# Patient Record
Sex: Male | Born: 1960 | Race: White | Hispanic: No | Marital: Married | Smoking: Never smoker
Health system: Southern US, Community
[De-identification: ages and names within clinical notes are randomized; demographics above are authoritative.]

## PROBLEM LIST (undated history)

## (undated) DIAGNOSIS — K219 Gastro-esophageal reflux disease without esophagitis: Secondary | ICD-10-CM

## (undated) DIAGNOSIS — F419 Anxiety disorder, unspecified: Secondary | ICD-10-CM

## (undated) DIAGNOSIS — I10 Essential (primary) hypertension: Secondary | ICD-10-CM

## (undated) DIAGNOSIS — T7840XA Allergy, unspecified, initial encounter: Secondary | ICD-10-CM

## (undated) DIAGNOSIS — M1990 Unspecified osteoarthritis, unspecified site: Secondary | ICD-10-CM

## (undated) HISTORY — DX: Allergy, unspecified, initial encounter: T78.40XA

## (undated) HISTORY — DX: Essential (primary) hypertension: I10

## (undated) HISTORY — PX: TONSILLECTOMY: SUR1361

## (undated) HISTORY — DX: Unspecified osteoarthritis, unspecified site: M19.90

## (undated) HISTORY — DX: Anxiety disorder, unspecified: F41.9

## (undated) HISTORY — DX: Gastro-esophageal reflux disease without esophagitis: K21.9

## (undated) HISTORY — PX: EYE SURGERY: SHX253

---

## 2001-08-24 ENCOUNTER — Emergency Department (HOSPITAL_COMMUNITY): Admission: EM | Admit: 2001-08-24 | Discharge: 2001-08-24 | Payer: Self-pay | Admitting: *Deleted

## 2008-09-08 ENCOUNTER — Encounter: Admission: RE | Admit: 2008-09-08 | Discharge: 2008-09-08 | Payer: Self-pay | Admitting: Family Medicine

## 2010-05-22 ENCOUNTER — Emergency Department (HOSPITAL_COMMUNITY)
Admission: EM | Admit: 2010-05-22 | Discharge: 2010-05-22 | Payer: Self-pay | Source: Home / Self Care | Admitting: Emergency Medicine

## 2010-07-31 LAB — BASIC METABOLIC PANEL
Calcium: 9.6 mg/dL (ref 8.4–10.5)
Creatinine, Ser: 1.11 mg/dL (ref 0.4–1.5)
GFR calc Af Amer: >60 mL/min (ref >60)
GFR calc non Af Amer: >60 mL/min (ref >60)

## 2010-07-31 LAB — DIFFERENTIAL
Eosinophils Absolute: 0 10*3/uL (ref 0.0–0.7)
Lymphocytes Relative: 8 % — ABNORMAL LOW (ref 12–46)
Lymphs Abs: 0.8 10*3/uL (ref 0.7–4.0)
Neutro Abs: 9.2 10*3/uL — ABNORMAL HIGH (ref 1.7–7.7)
Neutrophils Relative %: 84 % — ABNORMAL HIGH (ref 43–77)

## 2010-07-31 LAB — CBC
Platelets: 270 10*3/uL (ref 150–400)
RBC: 5.47 MIL/uL (ref 4.22–5.81)
WBC: 11 10*3/uL — ABNORMAL HIGH (ref 4.0–10.5)

## 2011-09-19 HISTORY — PX: COLONOSCOPY: SHX174

## 2011-12-15 ENCOUNTER — Other Ambulatory Visit: Payer: Self-pay | Admitting: Physician Assistant

## 2012-01-01 ENCOUNTER — Encounter: Payer: Self-pay | Admitting: Physician Assistant

## 2012-06-06 IMAGING — CT CT HEAD W/O CM
1 series · 15 of 30 positions shown, 19 images · non-contrast
Comparison: 09/08/2008

CLINICAL DATA: Headache.  Sore throat.  Sinus pressure.

CT HEAD WITHOUT CONTRAST
TECHNIQUE: Contiguous axial images were obtained from the base of
the skull through the vertex without contrast.

[Series 2: head_seq 4.5 h37s st · axial · 0.43mm/px · z∈[+1183,+1327]mm · 15 of 36 slices shown, 19 images]
[im 2/36  brain]
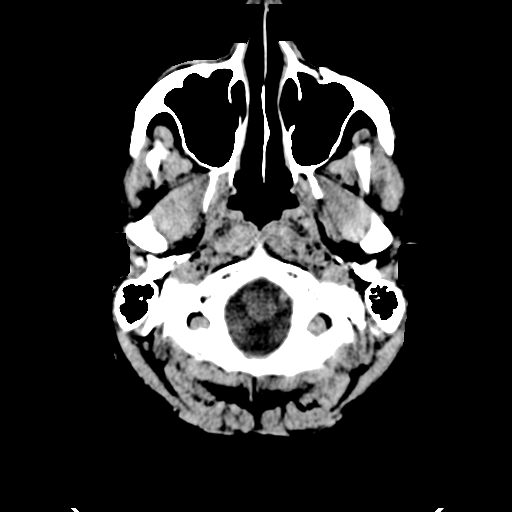
[im 2/36  bone]
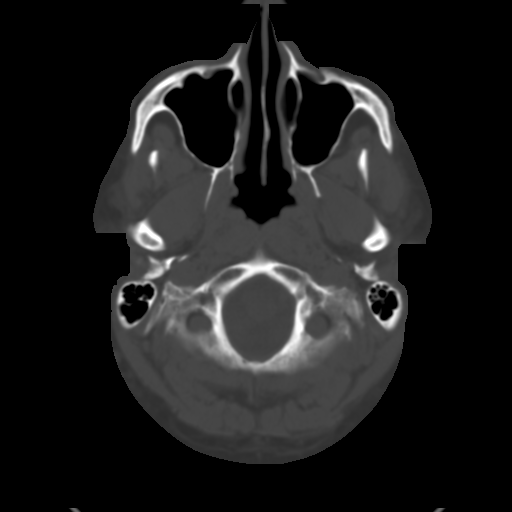
[im 4/36  brain]
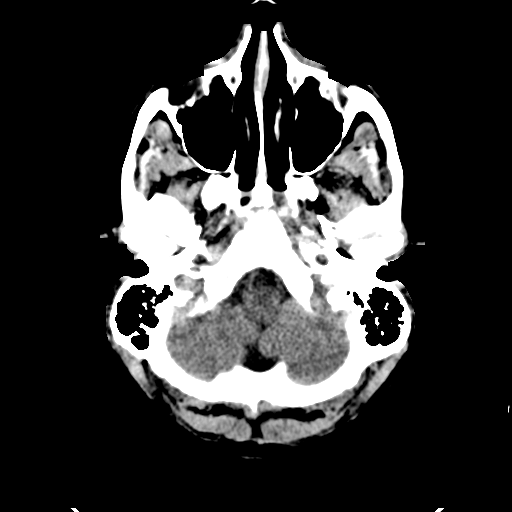
[im 7/36  brain]
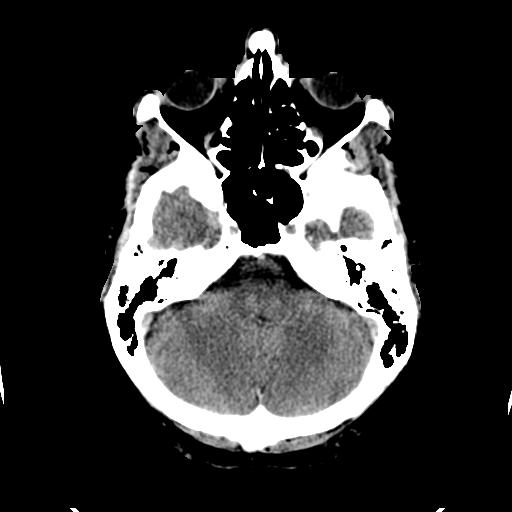
[im 9/36  brain]
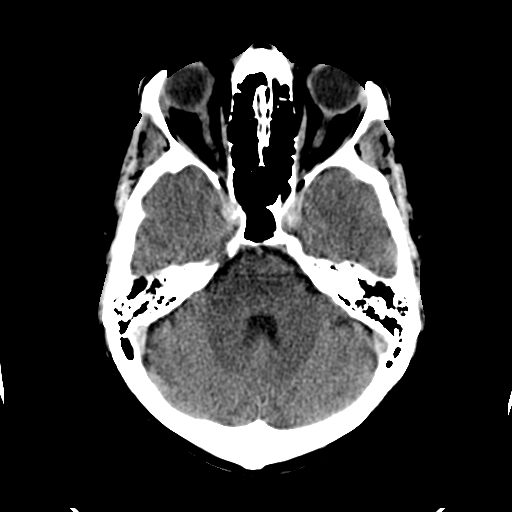
[im 11/36  brain]
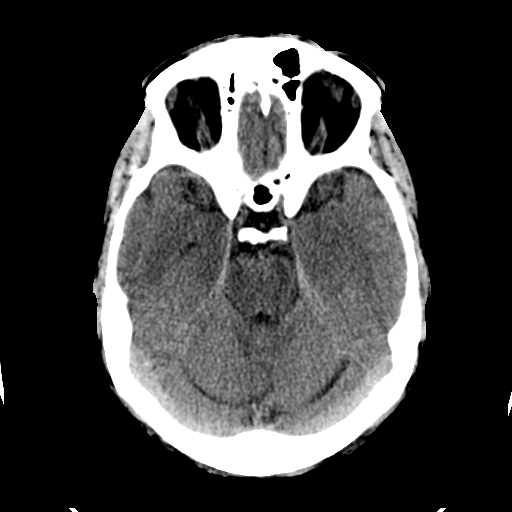
[im 11/36  bone]
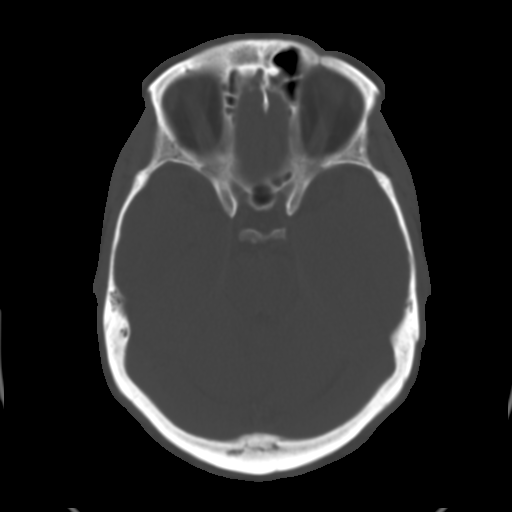
[im 14/36  brain]
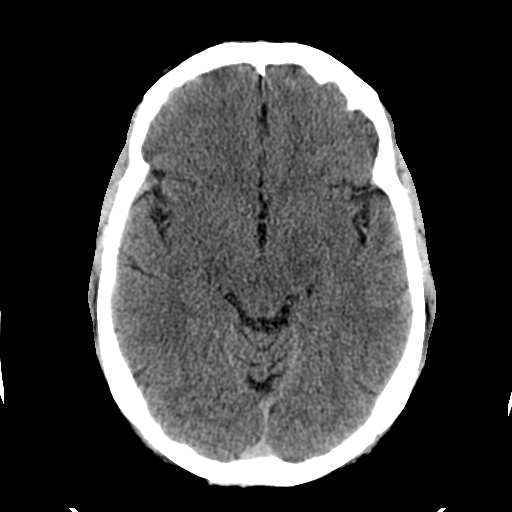
[im 16/36  brain]
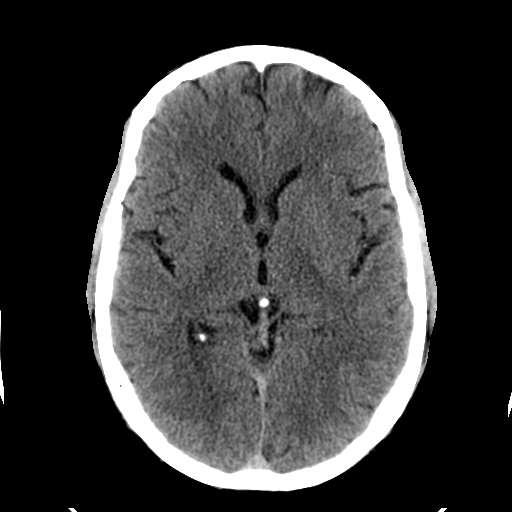
[im 19/36  brain]
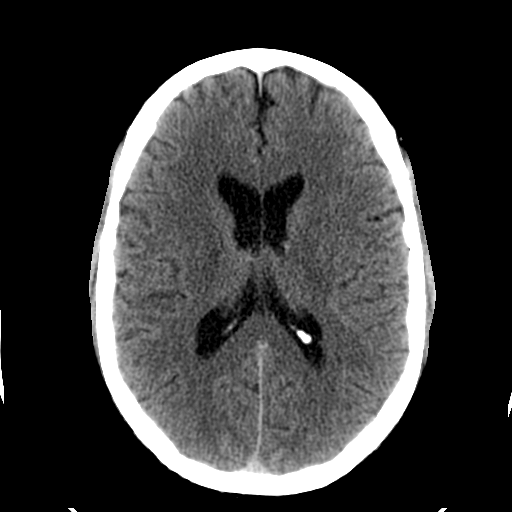
[im 20/36  brain]
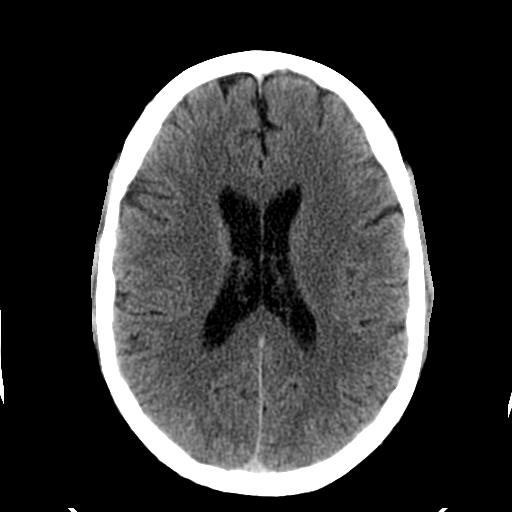
[im 20/36  bone]
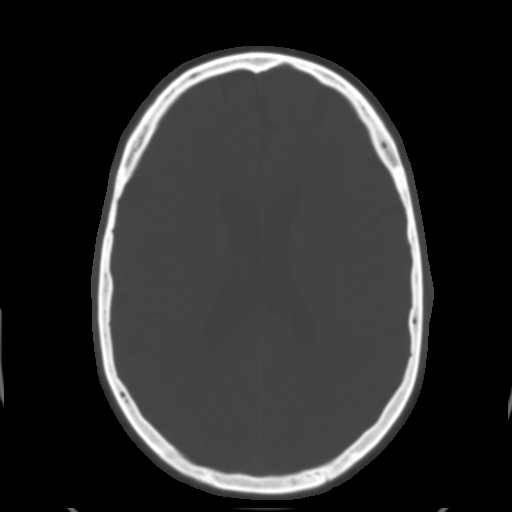
[im 22/36  brain]
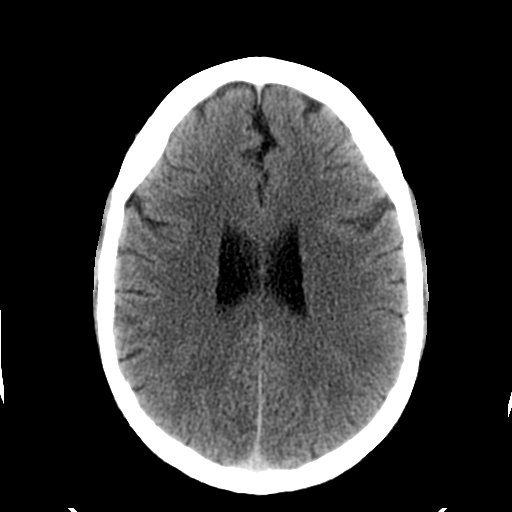
[im 25/36  brain]
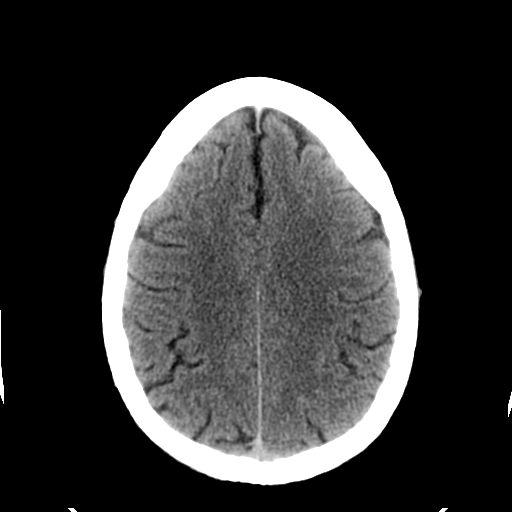
[im 27/36  brain]
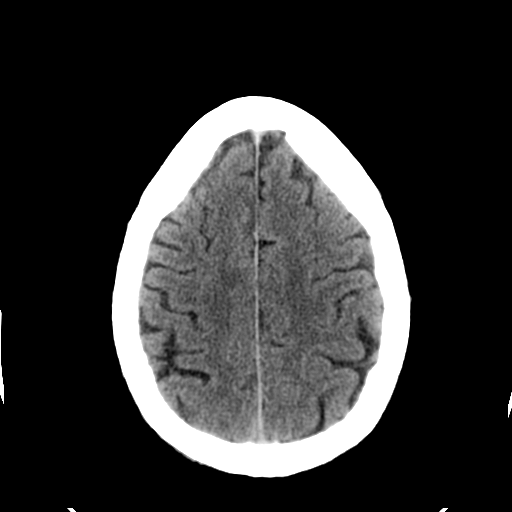
[im 29/36  brain]
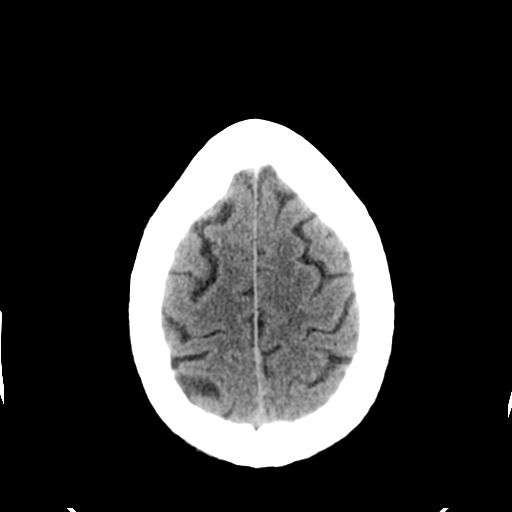
[im 29/36  bone]
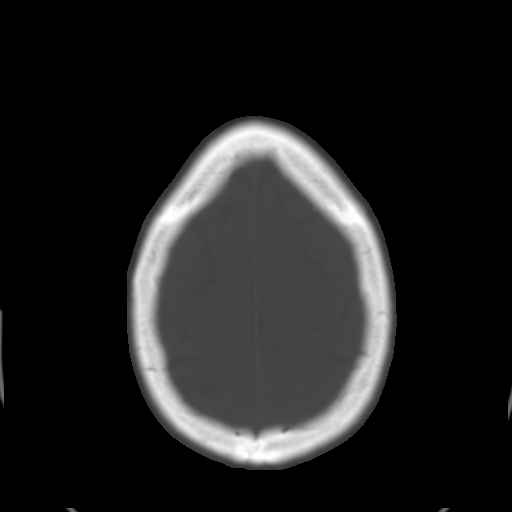
[im 32/36  brain]
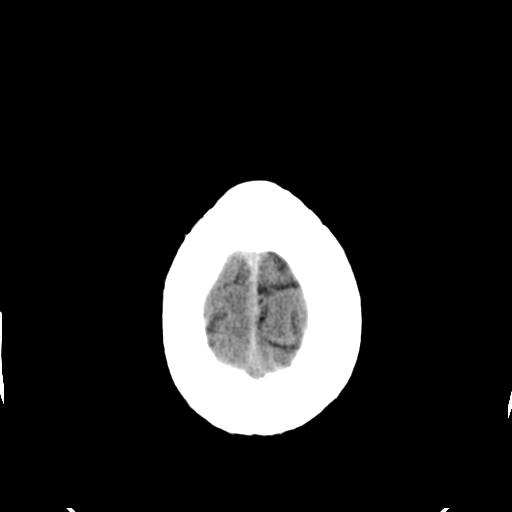
[im 34/36  brain]
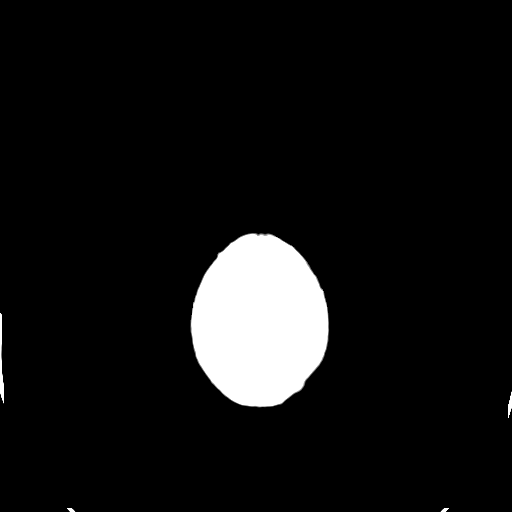

[15 of 30 positions shown; findings below may reference images not displayed]

FINDINGS: The brain stem, cerebellum, cerebral peduncles, thalami,
basal ganglia, basilar cisterns, and ventricular system appear
unremarkable.

No intracranial hemorrhage or acute infarction is identified.

There is minimal chronic left ethmoid sinusitis.  The remaining
visualized paranasal sinuses appear clear.

A dense calcification along the medial side of the tip of the left
lateral ventricle temporal horn measures 0.8 x 0.6 cm on image 13
of series 2, without surrounding temporal horn dilatation, or
obvious mass effect.
IMPRESSION: 1.  Minimal chronic left ethmoid sinusitis.
2.  Calcification in the temporal horn the left lateral ventricle.
Although this may simply represent calcified cord plexus, other
possibilities might include meningioma, calcified cord plexus
papilloma, or ependymoma. There is also a described association of
calcifications in the temporal horns of the lateral ventricles with
neurofibromatosis type 2.   Given the lack of aggressive findings,
I would recommend follow-up imaging by CT or MRI in 6-12 months to
ensure stability, or earlier if the patient has progressive
neurologic symptoms.

## 2014-08-31 ENCOUNTER — Ambulatory Visit (INDEPENDENT_AMBULATORY_CARE_PROVIDER_SITE_OTHER): Payer: BLUE CROSS/BLUE SHIELD | Admitting: Emergency Medicine

## 2014-08-31 ENCOUNTER — Encounter: Payer: Self-pay | Admitting: Family Medicine

## 2014-08-31 VITALS — BP 163/83 | HR 94 | Temp 97.9°F | Resp 16 | Ht 70.5 in | Wt 210.0 lb

## 2014-08-31 DIAGNOSIS — Z23 Encounter for immunization: Secondary | ICD-10-CM | POA: Diagnosis not present

## 2014-08-31 DIAGNOSIS — B353 Tinea pedis: Secondary | ICD-10-CM | POA: Diagnosis not present

## 2014-08-31 DIAGNOSIS — Z125 Encounter for screening for malignant neoplasm of prostate: Secondary | ICD-10-CM

## 2014-08-31 DIAGNOSIS — R079 Chest pain, unspecified: Secondary | ICD-10-CM

## 2014-08-31 DIAGNOSIS — Z0000 Encounter for general adult medical examination without abnormal findings: Secondary | ICD-10-CM | POA: Diagnosis not present

## 2014-08-31 DIAGNOSIS — R0609 Other forms of dyspnea: Secondary | ICD-10-CM | POA: Diagnosis not present

## 2014-08-31 DIAGNOSIS — I10 Essential (primary) hypertension: Secondary | ICD-10-CM | POA: Diagnosis not present

## 2014-08-31 LAB — CBC
HCT: 46.1 % (ref 39.0–52.0)
HEMOGLOBIN: 16.7 g/dL (ref 13.0–17.0)
MCH: 30.9 pg (ref 26.0–34.0)
MCHC: 36.2 g/dL — ABNORMAL HIGH (ref 30.0–36.0)
MCV: 85.2 fL (ref 78.0–100.0)
MPV: 8.8 fL (ref 8.6–12.4)
PLATELETS: 261 10*3/uL (ref 150–400)
RBC: 5.41 MIL/uL (ref 4.22–5.81)
RDW: 13.3 % (ref 11.5–15.5)
WBC: 6.5 10*3/uL (ref 4.0–10.5)

## 2014-08-31 LAB — POCT URINALYSIS DIPSTICK
Bilirubin, UA: NEGATIVE
Blood, UA: NEGATIVE
GLUCOSE UA: NEGATIVE
KETONES UA: NEGATIVE
Leukocytes, UA: NEGATIVE
Nitrite, UA: NEGATIVE
PH UA: 8.5
PROTEIN UA: NEGATIVE
SPEC GRAV UA: 1.015
Urobilinogen, UA: 0.2

## 2014-08-31 LAB — COMPREHENSIVE METABOLIC PANEL
ALBUMIN: 4.4 g/dL (ref 3.5–5.2)
ALT: 21 U/L (ref 0–53)
AST: 21 U/L (ref 0–37)
Alkaline Phosphatase: 55 U/L (ref 39–117)
BILIRUBIN TOTAL: 1 mg/dL (ref 0.2–1.2)
BUN: 13 mg/dL (ref 6–23)
CALCIUM: 9.3 mg/dL (ref 8.4–10.5)
CHLORIDE: 103 meq/L (ref 96–112)
CO2: 26 mEq/L (ref 19–32)
CREATININE: 0.98 mg/dL (ref 0.50–1.35)
Glucose, Bld: 88 mg/dL (ref 70–99)
Potassium: 4.3 mEq/L (ref 3.5–5.3)
Sodium: 138 mEq/L (ref 135–145)
TOTAL PROTEIN: 6.8 g/dL (ref 6.0–8.3)

## 2014-08-31 LAB — LIPID PANEL
Cholesterol: 163 mg/dL (ref 0–200)
HDL: 47 mg/dL (ref >=40)
LDL CALC: 109 mg/dL — AB (ref 0–99)
TRIGLYCERIDES: 33 mg/dL (ref <150)
Total CHOL/HDL Ratio: 3.5 Ratio
VLDL: 7 mg/dL (ref 0–40)

## 2014-08-31 LAB — TSH: TSH: 0.625 u[IU]/mL (ref 0.350–4.500)

## 2014-08-31 MED ORDER — LISINOPRIL 10 MG PO TABS: 10.0000 mg | ORAL_TABLET | Freq: Every day | ORAL | Status: DC

## 2014-08-31 NOTE — Progress Notes (Signed)
Subjective:    Patient ID: Joel Morales, male    DOB: [birth year], 54 y.o.   MRN: [id number]  HPI Patient presents today for CPE- Last CPE- at least 3 years ago  Tdap- today Colonoscopy- Dr. Elnoria HowardHung, repeat in 5 years Dentist- regular Eye- every 4-5 years Exercise- not regular  Has had left substernal/left sided chest pain 6x a day, lasts 30 seconds. Feels like muscle soreness. Never at night. Notices it more when he lies on his left side. No accompanying SOB, diaphoresis or nausea. Has had indigestion in the past and this is very different. The patient thinks the pain started when he moved and placed 26 bags of mulch. He thinks it has a musculoskeletal component, but his wife is concerned and would like him referred to cardiology.   He has a history or elevated blood pressure and was started on Lisinopril several years ago by Dr. Patsy Lageropland. He took it for awhile, tolerated without side effects, but stopped taking and did not return for followup. He is open to taking something for his blood pressure and does not have a preference as to agent.   Has noticed SOB when he goes up stairs, not sure if this is due to recent deconditioning. He has not been exercising regularly due to his work schedule.   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 They are short staffed. He expects this to be self limiting until more help is hired. He and his wife were able to get away on a cruise 2 months ago and he was able to enjoy himself. He does not describe himself as a typically anxious person. He is not getting as much sleep as he would like due to his work hours. He occasionally takes diphenhydramine and/or melatonin with good results.   He has noticed some peeling and flaking of the soles of his feet as well as some areas of dryness and itching between his toes. Has applied some lotion. Has not tried antifungal.    Review of Systems  Respiratory: Positive for chest tightness and  shortness of breath.   Cardiovascular: Positive for chest pain.  Gastrointestinal: Positive for anal bleeding.  Genitourinary: Positive for frequency and difficulty urinating.  Musculoskeletal: Positive for back pain.  Psychiatric/Behavioral: The patient is nervous/anxious.       Objective:   Physical Exam  Constitutional: He is oriented to person, place, and time. He appears well-developed and well-nourished.  Appears mildly anxious   HENT:  Head: Normocephalic and atraumatic.  Right Ear: Tympanic membrane, external ear and ear canal normal.  Left Ear: Tympanic membrane, external ear and ear canal normal.  Nose: Nose normal.  Mouth/Throat: Oropharynx is clear and moist. No oropharyngeal exudate.  Eyes: Conjunctivae are normal. Pupils are equal, round, and reactive to light.  Neck: Normal range of motion. Neck supple. No JVD present.  Cardiovascular: Normal rate, regular rhythm, normal heart sounds and intact distal pulses.   Pulmonary/Chest: Effort normal and breath sounds normal.  Abdominal: Soft. Bowel sounds are normal. Hernia confirmed negative in the right inguinal area and confirmed negative in the left inguinal area.  Genitourinary: Rectum normal, prostate normal, testes normal and penis normal.  Musculoskeletal: Normal range of motion. He exhibits no edema or tenderness.  Lymphadenopathy:    He has no cervical adenopathy.       Right: No inguinal adenopathy present.       Left: No inguinal adenopathy present.  Neurological: He is alert  and oriented to person, place, and time. He has normal reflexes.  Skin: Skin is warm and dry.  Bilateral soles of feet with some flaking and peeling. Few areas erythema between toes.   Psychiatric: He has a normal mood and affect. His behavior is normal. Judgment and thought content normal.  Vitals reviewed.  Blood pressure 163/83, pulse 94, temperature 97.9 F (36.6 C), resp. rate 16, height 5' 10.5" (1.791 m), weight 210 lb (95.255 kg),  SpO2 100 %. Recheck BP- 134/94 EKG reviewed with Dr. Cleta Alberts- NSR    Assessment & Plan:  1. Annual physical exam - POCT urinalysis dipstick - Tdap vaccine greater than or equal to 7yo IM  2. Need for Tdap vaccination  3. Chest pain, unspecified chest pain type - CBC - Comprehensive metabolic panel - Lipid panel - TSH - EKG 12-Lead - amb ref to cardiology  4. DOE (dyspnea on exertion) - Comprehensive metabolic panel - TSH - EKG 12-Lead - amb ref to cardiology  5. Tinea pedis of both feet - try OTC Lotrimin twice a day - keep feet clean and dry  6. Screening for prostate cancer - PSA  7. Essential hypertension - Comprehensive metabolic panel - Lipid panel - TSH - EKG 12-Lead - lisinopril (PRINIVIL,ZESTRIL) 10 MG tablet; Take 1 tablet (10 mg total) by mouth daily.  Dispense: 30 tablet; Refill: 3 - POCT urinalysis dipstick  - Patient will check his blood pressure a couple of times a week and keep a log.  - Follow up in 2-3 months depending on labs  Emi Belfast, FNP-BC  [facility]  08/31/2014 11:08 PM

## 2014-09-01 LAB — PSA: PSA: 4.87 ng/mL — ABNORMAL HIGH (ref <=4.00)

## 2014-10-07 ENCOUNTER — Ambulatory Visit (INDEPENDENT_AMBULATORY_CARE_PROVIDER_SITE_OTHER): Payer: BLUE CROSS/BLUE SHIELD | Admitting: Cardiovascular Disease

## 2014-10-07 ENCOUNTER — Encounter: Payer: Self-pay | Admitting: Cardiovascular Disease

## 2014-10-07 VITALS — BP 140/88 | HR 78 | Ht 70.0 in | Wt 213.5 lb

## 2014-10-07 DIAGNOSIS — R0789 Other chest pain: Secondary | ICD-10-CM | POA: Insufficient documentation

## 2014-10-07 DIAGNOSIS — R079 Chest pain, unspecified: Secondary | ICD-10-CM | POA: Diagnosis not present

## 2014-10-07 DIAGNOSIS — R0602 Shortness of breath: Secondary | ICD-10-CM

## 2014-10-07 NOTE — Progress Notes (Signed)
Cardiology Office Note   Date:  10/07/2014   ID:  Joel Cunaslexander B [place], DOB 02/17/1961, MRN [id number]  PCP:  Lucilla EdinAUB, STEVE A, MD  Cardiologist:   Vesta MixerNahser, Philip J, MD   Chief Complaint  Patient presents with  . Chest Pain    Ref by Dr. Cleta Albertsaub for chest pain & HTN. Pt. c/o chest discomfort and shortness of breath since March 2016.     1. Chest pain: 2. Essential Hypertension     Oct 07, 2014:  Joel Morales is a 54 y.o. male who presents for  Chest pain  Pain is worse with movement, stretching,  Mid sternal - to mid left chest Occurs off and on through the day  Also has palpitations , causes him to pause in his breathing Has some DOE No regular exercise  does his own yard work, no increase in pain with that.   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 Allergy   . Anxiety   . Arthritis   . Hypertension     Past Surgical History  Procedure Laterality Date  . Tonsillectomy       Current Outpatient Prescriptions  Medication Sig Dispense Refill  . aspirin EC 81 MG tablet Take 81 mg by mouth daily.    . folic acid (FOLVITE) 800 MCG tablet Take 400 mcg by mouth daily.    Marland Kitchen. lisinopril (PRINIVIL,ZESTRIL) 10 MG tablet Take 1 tablet (10 mg total) by mouth daily. 30 tablet 3   No current facility-administered medications for this visit.    Allergies:   Review of patient's allergies indicates no known allergies.    Social History:  The patient  reports that he has never smoked. He does not have any smokeless tobacco history on file. He reports that he drinks alcohol. He reports that he does not use illicit drugs.   Family History:  The patient's family history includes Cancer in his maternal grandfather; Heart disease (age of onset: 5474) in his mother; Hypertension in his maternal grandmother and mother; Stroke in his maternal grandfather and maternal grandmother.    ROS:  Please see the history of present illness.     Review of Systems: Constitutional:  denies fever, chills, diaphoresis, appetite change and fatigue.  HEENT: denies photophobia, eye pain, redness, hearing loss, ear pain, congestion, sore throat, rhinorrhea, sneezing, neck pain, neck stiffness and tinnitus.  Respiratory: denies SOB, DOE, cough, chest tightness, and wheezing.  Cardiovascular: admits to chest pain, palpitations, denies  leg swelling.  Gastrointestinal: denies nausea, vomiting, abdominal pain, diarrhea, constipation, blood in stool.  Genitourinary: denies dysuria, urgency, frequency, hematuria, flank pain and difficulty urinating.  Musculoskeletal: denies  myalgias, back pain, joint swelling, arthralgias and gait problem.   Skin: denies pallor, rash and wound.  Neurological: denies dizziness, seizures, syncope, weakness, light-headedness, numbness and headaches.   Hematological: denies adenopathy, easy bruising, personal or family bleeding history.  Psychiatric/ Behavioral: denies suicidal ideation, mood changes, confusion, nervousness, sleep disturbance and agitation.       All other systems are reviewed and negative.    PHYSICAL EXAM: VS:  BP 140/88 mmHg  Pulse 78  Ht 5\' 10"  (1.778 m)  Wt 96.843 kg (213 lb 8 oz)  BMI 30.63 kg/m2 , BMI Body mass index is 30.63 kg/(m^2). GEN: Well nourished, well developed, in no acute distress HEENT: normal Neck: no JVD, carotid bruits, or masses Cardiac: RRR; no murmurs, rubs,  or gallops,no edema  Respiratory:  clear to auscultation bilaterally, normal work of breathing GI: soft, nontender, nondistended, + BS MS: no deformity or atrophy Skin: warm and dry, no rash Neuro:  Strength and sensation are intact Psych: normal   EKG:  EKG is ordered today. The ekg ordered today demonstrates NSR at 78, normal ecg    Recent Labs: 08/31/2014: ALT 21; BUN 13; Creatinine 0.98; Hemoglobin 16.7; Platelets 261; Potassium 4.3; Sodium 138; TSH 0.625    Lipid Panel    Component Value  Date/Time   CHOL 163 08/31/2014 1148   TRIG 33 08/31/2014 1148   HDL 47 08/31/2014 1148   CHOLHDL 3.5 08/31/2014 1148   VLDL 7 08/31/2014 1148   LDLCALC 109* 08/31/2014 1148      Wt Readings from Last 3 Encounters:  10/07/14 96.843 kg (213 lb 8 oz)  08/31/14 95.255 kg (210 lb)      Other studies Reviewed: Additional studies/ records that were reviewed today include: . Review of the above records demonstrates:    ASSESSMENT AND PLAN:  1.  Atypical chest pain: The patient lives with atypical chest pains. These pains typically occur when he twists his torso or moves around or takes a deep breath. He denies any pain doing yard work or walking. The pain last for a few seconds. Occurs off and on through the day.  Does not worsen with eating or drinking.  I suspect this is a musculoskeletal issue. I had suggested that he try Motrin 3 times a day for least 5-6 days to see if this helps resolve. I had suggested that he start a regular exercise program. If he's able to advance in the exercise program without episodes of chest pain that I think that we can safely say that he does not have any significant coronary artery disease.  On the other hand if he does have problems with additional chest pain with walking, I've encouraged him to call me for further evaluation. I offered to perform a stress test but he agrees that this is probably not necessary at this time.  2. Palpitations: His palpitations clinically sound like premature ventricular contractions. I've encouraged him to avoid stimulants such as caffeine. I've encouraged him to get more sleep. I've also advised him to eat more foods that contain high levels of potassium.   Current medicines are reviewed at length with the patient today.  The patient does not have concerns regarding medicines.  The following changes have been made:  no change  Labs/ tests ordered today include:  Orders Placed This Encounter  Procedures  . EKG  12-Lead     Disposition:   FU with me as needed     Nahser, Deloris PingPhilip J, MD  10/07/2014 2:25 PM    [facility] [address], [place], [place]  [address] Phone: [phone]; Fax: [phone]   [facility]  [address] [place], [place]  [address] [phone]    Fax [phone]

## 2014-10-07 NOTE — Patient Instructions (Signed)
Medication Instructions:  Your physician recommends that you continue on your current medications as directed. Please refer to the Current Medication list given to you today.   Labwork: None  Testing/Procedures: None  Follow-Up: Your physician recommends that you follow-up as needed with Dr. Elease HashimotoNahser   Any Other Special Instructions Will Be Listed Below (If Applicable).

## 2014-10-27 ENCOUNTER — Other Ambulatory Visit: Payer: Self-pay

## 2014-10-27 DIAGNOSIS — I10 Essential (primary) hypertension: Secondary | ICD-10-CM

## 2014-10-27 MED ORDER — LISINOPRIL 10 MG PO TABS: 10.0000 mg | ORAL_TABLET | Freq: Every day | ORAL | Status: DC

## 2014-11-19 DIAGNOSIS — N401 Enlarged prostate with lower urinary tract symptoms: Secondary | ICD-10-CM | POA: Insufficient documentation

## 2014-11-19 DIAGNOSIS — K644 Residual hemorrhoidal skin tags: Secondary | ICD-10-CM | POA: Insufficient documentation

## 2015-02-22 ENCOUNTER — Encounter: Payer: Self-pay | Admitting: Emergency Medicine

## 2017-04-02 ENCOUNTER — Other Ambulatory Visit: Payer: Self-pay

## 2017-04-02 ENCOUNTER — Encounter: Payer: Self-pay | Admitting: Physician Assistant

## 2017-04-02 ENCOUNTER — Ambulatory Visit (INDEPENDENT_AMBULATORY_CARE_PROVIDER_SITE_OTHER): Payer: 59 | Admitting: Physician Assistant

## 2017-04-02 VITALS — BP 146/86 | HR 95 | Temp 99.3°F | Resp 16 | Ht 69.5 in | Wt 219.2 lb

## 2017-04-02 DIAGNOSIS — J069 Acute upper respiratory infection, unspecified: Secondary | ICD-10-CM

## 2017-04-02 DIAGNOSIS — R0981 Nasal congestion: Secondary | ICD-10-CM

## 2017-04-02 DIAGNOSIS — R05 Cough: Secondary | ICD-10-CM

## 2017-04-02 DIAGNOSIS — R059 Cough, unspecified: Secondary | ICD-10-CM

## 2017-04-02 MED ORDER — HYDROCODONE-HOMATROPINE 5-1.5 MG/5ML PO SYRP: 5.0000 mL | ORAL_SOLUTION | Freq: Three times a day (TID) | ORAL | 0 refills | Status: DC | PRN

## 2017-04-02 MED ORDER — GUAIFENESIN ER 1200 MG PO TB12: 1.0000 | ORAL_TABLET | Freq: Two times a day (BID) | ORAL | 1 refills | Status: DC | PRN

## 2017-04-02 MED ORDER — BENZONATATE 100 MG PO CAPS: 100.0000 mg | ORAL_CAPSULE | Freq: Three times a day (TID) | ORAL | 0 refills | Status: DC | PRN

## 2017-04-02 NOTE — Patient Instructions (Addendum)
Stay well hydrated and get lost of rest! Come back in 5-7 days if you are not better.    Advil or ibuprofen for pain. Do not take Aspirin.  Throat lozenges (if you are not at risk for choking) or sprays may be used to soothe your throat. Drink enough water and fluids to keep your urine clear or pale yellow. For sore throat: ? Gargle with 8 oz of salt water ( tsp of salt per 1 qt of water) as often as every 1-2 hours to soothe your throat.  Gargle liquid benadryl for sore throat.  Use Elderberry syrup.   For sore throat try using a honey-based tea. Use 3 teaspoons of honey with juice squeezed from half lemon. Place shaved pieces of ginger into 1/2-1 cup of water and warm over stove top. Then mix the ingredients and repeat every 4 hours as needed.  Cough Syrup Recipe: Sweet Lemon & Honey Thyme  Ingredients a handful of fresh thyme sprigs   1 pint of water (2 cups)  1/2 cup honey (raw is best, but regular will do)  1/2 lemon chopped Instructions 1. Place the lemon in the pint jar and cover with the honey. The honey will macerate the lemons and draw out liquids which taste so delicious! 2. Meanwhile, toss the thyme leaves into a saucepan and cover them with the water. 3. Bring the water to a gentle simmer and reduce it to half, about a cup of tea. 4. When the tea is reduced and cooled a bit, strain the sprigs & leaves, add it into the pint jar and stir it well. 5. Give it a shake and use a spoonful as needed. 6. Store your homemade cough syrup in the refrigerator for about a month.  What causes a cough? In adults, common causes of a cough include: ?An infection of the airways or lungs (such as the common cold) ?Postnasal drip - Postnasal drip is when mucus from the nose drips down or flows along the back of the throat. Postnasal drip can happen when people have: .A cold .Allergies .A sinus infection - The sinuses are hollow areas in the bones of the face that open into the nose. ?Lung  conditions, like asthma and chronic obstructive pulmonary disease (COPD) - Both of these conditions can make it hard to breathe. COPD is usually caused by smoking. ?Acid reflux - Acid reflux is when the acid that is normally in your stomach backs up into your esophagus (the tube that carries food from your mouth to your stomach). ?A side effect from blood pressure medicines called "ACE inhibitors" ?Smoking cigarettes  Is there anything I can do on my own to get rid of my cough? Yes. To help get rid of your cough, you can: ?Use a humidifier in your bedroom ?Use an over-the-counter cough medicine, or suck on cough drops or hard candy ?Stop smoking, if you smoke ?If you have allergies, avoid the things you are allergic to (like pollen, dust, animals, or mold) If you have acid reflux, your doctor or nurse will tell you which lifestyle changes can help reduce symptoms.    IF you received an x-ray today, you will receive an invoice from [facility]. Please contact [facility] at [phone] with questions or concerns regarding your invoice.   IF you received labwork today, you will receive an invoice from [facility]. Please contact [facility] at [phone] with questions or concerns regarding your invoice.   Our billing staff will not be able to  assist you with questions regarding bills from these companies.  You will be contacted with the lab results as soon as they are available. The fastest way to get your results is to activate your My Chart account. Instructions are located on the last page of this paperwork. If you have not heard from [place] regarding the results in 2 weeks, please contact this office.

## 2017-04-02 NOTE — Progress Notes (Signed)
Joel Morales  MRN: [id number] DOB: 1960/05/28  PCP: Collene Gobbleaub, Steven A, MD  Subjective:  Pt is a 56 year old male PMH HTN who presents to clinic for cough x 4 days. Endorses one episode of night sweats 2 nights ago. Temp of 99.  Not sleeping well due to cough. He is feeling better this afternoon than he was this morning. He has tried Benadryl and flonase.  Denies shob, wheezing, palpitations, n/v/d, abdominal pain.   Review of Systems  Constitutional: Positive for chills and fever. Negative for diaphoresis and fatigue.  HENT: Positive for congestion, postnasal drip, rhinorrhea, sinus pressure and sore throat. Negative for ear pain, sinus pain, sneezing and trouble swallowing.   Respiratory: Positive for cough. Negative for chest tightness, shortness of breath and wheezing.   Gastrointestinal: Negative for constipation, nausea and vomiting.  Psychiatric/Behavioral: Positive for sleep disturbance.    Patient Active Problem List   Diagnosis Date Noted  . Atypical chest pain 10/07/2014    Current Outpatient Medications on File Prior to Visit  Medication Sig Dispense Refill  . acetaminophen (TYLENOL) 500 MG tablet Take 500 mg every 6 (six) hours as needed by mouth.    . diphenhydrAMINE (BENADRYL) 25 MG tablet Take 25 mg every 6 (six) hours as needed by mouth.    . fluticasone (FLONASE) 50 MCG/ACT nasal spray Place daily into both nostrils.    . folic acid (FOLVITE) 800 MCG tablet Take 400 mcg by mouth daily.    . vitamin C (ASCORBIC ACID) 500 MG tablet Take 500 mg daily by mouth.    Marland Kitchen. aspirin EC 81 MG tablet Take 81 mg by mouth daily.    Marland Kitchen. lisinopril (PRINIVIL,ZESTRIL) 10 MG tablet Take 1 tablet (10 mg total) by mouth daily. (Patient not taking: Reported on 04/02/2017) 90 tablet 0   No current facility-administered medications on file prior to visit.     No Known Allergies   Objective:  BP (!) 146/86   Pulse 95   Temp 99.3 F (37.4 C) (Oral)   Resp 16   Ht 5' 9.5" (1.765  m)   Wt 219 lb 3.2 oz (99.4 kg)   SpO2 99%   BMI 31.91 kg/m   Physical Exam  Constitutional: He is oriented to person, place, and time and well-developed, well-nourished, and in no distress. No distress.  HENT:  Right Ear: Tympanic membrane normal.  Left Ear: Tympanic membrane normal.  Nose: Mucosal edema present. No rhinorrhea. Right sinus exhibits no maxillary sinus tenderness and no frontal sinus tenderness. Left sinus exhibits no maxillary sinus tenderness and no frontal sinus tenderness.  Mouth/Throat: Mucous membranes are normal. Posterior oropharyngeal edema present. No oropharyngeal exudate or posterior oropharyngeal erythema.  Cardiovascular: Normal rate, regular rhythm and normal heart sounds.  Pulmonary/Chest: Effort normal and breath sounds normal. No respiratory distress. He has no wheezes. He has no rales.  Neurological: He is alert and oriented to person, place, and time. GCS score is 15.  Skin: Skin is warm and dry.  Psychiatric: Mood, memory, affect and judgment normal.  Vitals reviewed.   Assessment and Plan :  1. Acute upper respiratory infection - Suspect viral illness, plan to treat supportively. Advised pt to push fluids and rest. OOW note written. RTC in 5-7 days if no improvement.   2. Cough - HYDROcodone-homatropine (HYCODAN) 5-1.5 MG/5ML syrup; Take 5 mLs every 8 (eight) hours as needed by mouth for cough.  Dispense: 120 mL; Refill: 0 - benzonatate (TESSALON) 100 MG capsule; Take  1-2 capsules (100-200 mg total) 3 (three) times daily as needed by mouth for cough.  Dispense: 40 capsule; Refill: 0  3. Nasal congestion - Guaifenesin (MUCINEX MAXIMUM STRENGTH) 1200 MG TB12; Take 1 tablet (1,200 mg total) every 12 (twelve) hours as needed by mouth.  Dispense: 14 tablet; Refill: 1   Whitney Roselynne Lortz, PA-C  [facility] 04/02/2017 1:53 PM

## 2018-07-20 DIAGNOSIS — M21612 Bunion of left foot: Secondary | ICD-10-CM | POA: Insufficient documentation

## 2018-07-20 DIAGNOSIS — M7712 Lateral epicondylitis, left elbow: Secondary | ICD-10-CM | POA: Insufficient documentation

## 2018-07-20 DIAGNOSIS — M7661 Achilles tendinitis, right leg: Secondary | ICD-10-CM

## 2018-07-20 DIAGNOSIS — M21611 Bunion of right foot: Secondary | ICD-10-CM | POA: Insufficient documentation

## 2018-07-20 HISTORY — DX: Achilles tendinitis, right leg: M76.61

## 2018-07-20 HISTORY — DX: Lateral epicondylitis, left elbow: M77.12

## 2019-10-23 ENCOUNTER — Telehealth: Payer: Self-pay | Admitting: General Practice

## 2019-10-23 NOTE — Telephone Encounter (Signed)
Patient submitted request for new patient appointment   Left message on voicemail for him to call back

## 2019-12-02 ENCOUNTER — Other Ambulatory Visit: Payer: Self-pay

## 2019-12-02 ENCOUNTER — Ambulatory Visit (INDEPENDENT_AMBULATORY_CARE_PROVIDER_SITE_OTHER): Payer: No Typology Code available for payment source | Admitting: Family Medicine

## 2019-12-02 ENCOUNTER — Encounter: Payer: Self-pay | Admitting: Family Medicine

## 2019-12-02 VITALS — BP 164/84 | HR 88 | Temp 97.9°F | Ht 70.0 in | Wt 210.4 lb

## 2019-12-02 DIAGNOSIS — M7660 Achilles tendinitis, unspecified leg: Secondary | ICD-10-CM | POA: Diagnosis not present

## 2019-12-02 DIAGNOSIS — Z1211 Encounter for screening for malignant neoplasm of colon: Secondary | ICD-10-CM | POA: Diagnosis not present

## 2019-12-02 DIAGNOSIS — R3912 Poor urinary stream: Secondary | ICD-10-CM

## 2019-12-02 DIAGNOSIS — I10 Essential (primary) hypertension: Secondary | ICD-10-CM

## 2019-12-02 DIAGNOSIS — G8929 Other chronic pain: Secondary | ICD-10-CM | POA: Diagnosis not present

## 2019-12-02 DIAGNOSIS — Z1321 Encounter for screening for nutritional disorder: Secondary | ICD-10-CM

## 2019-12-02 DIAGNOSIS — N401 Enlarged prostate with lower urinary tract symptoms: Secondary | ICD-10-CM

## 2019-12-02 DIAGNOSIS — Z0000 Encounter for general adult medical examination without abnormal findings: Secondary | ICD-10-CM | POA: Diagnosis not present

## 2019-12-02 DIAGNOSIS — R251 Tremor, unspecified: Secondary | ICD-10-CM | POA: Diagnosis not present

## 2019-12-02 DIAGNOSIS — M25522 Pain in left elbow: Secondary | ICD-10-CM | POA: Diagnosis not present

## 2019-12-02 DIAGNOSIS — Z125 Encounter for screening for malignant neoplasm of prostate: Secondary | ICD-10-CM | POA: Diagnosis not present

## 2019-12-02 LAB — CBC WITH DIFFERENTIAL/PLATELET
Basophils Absolute: 0 10*3/uL (ref 0.0–0.1)
Basophils Relative: 0.3 % (ref 0.0–3.0)
Eosinophils Absolute: 0 10*3/uL (ref 0.0–0.7)
Eosinophils Relative: 0.2 % (ref 0.0–5.0)
HCT: 47.9 % (ref 39.0–52.0)
Hemoglobin: 16.5 g/dL (ref 13.0–17.0)
Lymphocytes Relative: 23.2 % (ref 12.0–46.0)
Lymphs Abs: 1.5 10*3/uL (ref 0.7–4.0)
MCHC: 34.4 g/dL (ref 30.0–36.0)
MCV: 90.1 fl (ref 78.0–100.0)
Monocytes Absolute: 0.7 10*3/uL (ref 0.1–1.0)
Monocytes Relative: 11 % (ref 3.0–12.0)
Neutro Abs: 4.1 10*3/uL (ref 1.4–7.7)
Neutrophils Relative %: 65.3 % (ref 43.0–77.0)
Platelets: 268 10*3/uL (ref 150.0–400.0)
RBC: 5.32 Mil/uL (ref 4.22–5.81)
RDW: 13.3 % (ref 11.5–15.5)
WBC: 6.3 10*3/uL (ref 4.0–10.5)

## 2019-12-02 LAB — COMPREHENSIVE METABOLIC PANEL
ALT: 18 U/L (ref 0–53)
AST: 18 U/L (ref 0–37)
Albumin: 4.7 g/dL (ref 3.5–5.2)
Alkaline Phosphatase: 58 U/L (ref 39–117)
BUN: 11 mg/dL (ref 6–23)
CO2: 31 mEq/L (ref 19–32)
Calcium: 9.8 mg/dL (ref 8.4–10.5)
Chloride: 102 mEq/L (ref 96–112)
Creatinine, Ser: 1.11 mg/dL (ref 0.40–1.50)
GFR: 67.55 mL/min (ref >60.00)
Glucose, Bld: 97 mg/dL (ref 70–99)
Potassium: 4.2 mEq/L (ref 3.5–5.1)
Sodium: 138 mEq/L (ref 135–145)
Total Bilirubin: 0.9 mg/dL (ref 0.2–1.2)
Total Protein: 6.9 g/dL (ref 6.0–8.3)

## 2019-12-02 LAB — LIPID PANEL
Cholesterol: 154 mg/dL (ref 0–200)
HDL: 42.4 mg/dL (ref >39.00)
LDL Cholesterol: 104 mg/dL — ABNORMAL HIGH (ref 0–99)
NonHDL: 112.03
Total CHOL/HDL Ratio: 4
Triglycerides: 42 mg/dL (ref 0.0–149.0)
VLDL: 8.4 mg/dL (ref 0.0–40.0)

## 2019-12-02 LAB — PSA: PSA: 4.11 ng/mL — ABNORMAL HIGH (ref 0.10–4.00)

## 2019-12-02 LAB — VITAMIN D 25 HYDROXY (VIT D DEFICIENCY, FRACTURES): VITD: 29.09 ng/mL — ABNORMAL LOW (ref 30.00–100.00)

## 2019-12-02 LAB — TSH: TSH: 0.57 u[IU]/mL (ref 0.35–4.50)

## 2019-12-02 NOTE — Patient Instructions (Addendum)
Please check your blood pressure twice a week and keep a log  I will notify you of labs and plan going forward  Referral sent to Dr. Elnoria Howard for repeat colonoscopy  I'll send a message about your labs and additional follow up   Plantar Fasciitis Rehab Ask your health care provider which exercises are safe for you. Do exercises exactly as told by your health care provider and adjust them as directed. It is normal to feel mild stretching, pulling, tightness, or discomfort as you do these exercises. Stop right away if you feel sudden pain or your pain gets worse. Do not begin these exercises until told by your health care provider. Stretching and range-of-motion exercises These exercises warm up your muscles and joints and improve the movement and flexibility of your foot. These exercises also help to relieve pain. Plantar fascia stretch  1. Sit with your left / right leg crossed over your opposite knee. 2. Hold your heel with one hand with that thumb near your arch. With your other hand, hold your toes and gently pull them back toward the top of your foot. You should feel a stretch on the bottom of your toes or your foot (plantar fascia) or both. 3. Hold this stretch for__________ seconds. 4. Slowly release your toes and return to the starting position. Repeat __________ times. Complete this exercise __________ times a day. Gastrocnemius stretch, standing This exercise is also called a calf (gastroc) stretch. It stretches the muscles in the back of the upper calf. 1. Stand with your hands against a wall. 2. Extend your left / right leg behind you, and bend your front knee slightly. 3. Keeping your heels on the floor and your back knee straight, shift your weight toward the wall. Do not arch your back. You should feel a gentle stretch in your upper left / right calf. 4. Hold this position for __________ seconds. Repeat __________ times. Complete this exercise __________ times a day. Soleus  stretch, standing This exercise is also called a calf (soleus) stretch. It stretches the muscles in the back of the lower calf. 1. Stand with your hands against a wall. 2. Extend your left / right leg behind you, and bend your front knee slightly. 3. Keeping your heels on the floor, bend your back knee and shift your weight slightly over your back leg. You should feel a gentle stretch deep in your lower calf. 4. Hold this position for __________ seconds. Repeat __________ times. Complete this exercise __________ times a day. Gastroc and soleus stretch, standing step This exercise stretches the muscles in the back of the lower leg. These muscles are in the upper calf (gastrocnemius) and the lower calf (soleus). 1. Stand with the ball of your left / right foot on a step. The ball of your foot is on the walking surface, right under your toes. 2. Keep your other foot firmly on the same step. 3. Hold on to the wall or a railing for balance. 4. Slowly lift your other foot, allowing your body weight to press your left / right heel down over the edge of the step. You should feel a stretch in your left / right calf. 5. Hold this position for __________ seconds. 6. Return both feet to the step. 7. Repeat this exercise with a slight bend in your left / right knee. Repeat __________ times with your left / right knee straight and __________ times with your left / right knee bent. Complete this exercise __________ times a  day. Balance exercise This exercise builds your balance and strength control of your arch to help take pressure off your plantar fascia. Single leg stand If this exercise is too easy, you can try it with your eyes closed or while standing on a pillow. 1. Without shoes, stand near a railing or in a doorway. You may hold on to the railing or door frame as needed. 2. Stand on your left / right foot. Keep your big toe down on the floor and try to keep your arch lifted. Do not let your foot roll  inward. 3. Hold this position for __________ seconds. Repeat __________ times. Complete this exercise __________ times a day. This information is not intended to replace advice given to you by your health care provider. Make sure you discuss any questions you have with your health care provider. Document Revised: 08/28/2018 Document Reviewed: 03/05/2018 Elsevier Patient Education  2020 [employer].

## 2019-12-02 NOTE — Progress Notes (Signed)
Subjective:    Patient ID: Joel Morales, male    DOB: Apr 03, 1961, 59 y.o.   MRN: [id number]  HPI Chief Complaint  Patient presents with  . Annual Exam    having urinations problems  . right leg    tenderness at right heel on set 1 year  . Elbow Pain    left elbow on set 1 year  This is a 59 yo male who presents today to establish care and for annual exam.  Works for [employer]. Visits elderly parents in [place]. Lives with his wife.    Last CPE- 08/31/2014 PSA- 08/2014 Colonoscopy- Elnoria Howard- overdue Tdap- 08/31/2014 Covid- [facility] Flu- annual Dental- regular Eye- regular Exercise- 2x/ week, walking  HTN- previously on lisinopril, had orthostatic htn. Last night was 125/70, this am 141/82, high after working.  BPH symptoms- interested in medication, has had increased urgency, decreased stream, difficulty finishing/emptying, nocturia x 1. This has been longstanding and has dicussed medication with provider in past. Wonders about taking something that would also lower his BP.   Left elbow pain with lifting straight up on arm. Right handed. No known injury or over use recently. Has been taking Alleve 2 tablets as needed. Has not had recently. Pain with pressure. Does not interfere with sleep.   Right heel pain- worse in am and after sitting, along achilles. No worse with activity.   Some increased shaking with anxiety at work, asks about propranolol.  Review of Systems  Constitutional: Negative.   HENT: Negative.   Eyes: Negative.   Respiratory: Negative.   Cardiovascular: Negative.   Genitourinary: Positive for decreased urine volume, difficulty urinating and urgency. Negative for dysuria.  Musculoskeletal:       See HPI  Allergic/Immunologic: Negative.   Neurological: Positive for tremors. Negative for dizziness, light-headedness and headaches.  Hematological: Negative.   Psychiatric/Behavioral: Positive for sleep disturbance (longstanding, some improvement with melatonin).        Objective:   Physical Exam    BP (!) 162/84   Pulse 88   Temp 97.9 F (36.6 C)   Ht 5\' 10"  (1.778 m)   Wt 210 lb 6.4 oz (95.4 kg)   SpO2 99%   BMI 30.19 kg/m  Wt Readings from Last 3 Encounters:  12/02/19 210 lb 6.4 oz (95.4 kg)  04/02/17 219 lb 3.2 oz (99.4 kg)  10/07/14 213 lb 8 oz (96.8 kg)   BP Readings from Last 3 Encounters:  12/02/19 (!) 164/84  04/02/17 (!) 146/86  10/07/14 140/88   Depression screen PHQ 2/9 12/02/2019 04/02/2017 08/31/2014  Decreased Interest 0 0 0  Down, Depressed, Hopeless 0 0 0  PHQ - 2 Score 0 0 0       Assessment & Plan:  1. Annual physical exam - Discussed and encouraged healthy lifestyle choices- adequate sleep, regular exercise, stress management and healthy food choices.    2. Screening for prostate cancer - PSA  3. Essential hypertension - will check labs and discuss starting medication, ? Cardura XL to also treat BPH symptoms - Comprehensive metabolic panel - CBC with Differential - Lipid Panel - TSH  4. Encounter for vitamin deficiency screening - Vitamin D, 25-hydroxy  5. Tremor - will consider prn metoprolol when labs returned - TSH  6. Pain in Achilles tendon - discussed stretching, otc NSAIDs, ice/ heat - if no improvement with conservative treatment, can refer to podiatry  7. Chronic elbow pain, left - discussed trying to identify triggers, bracing, heat/ ice  8.  BPH - will check labs, if ok, consider starting alpha blocker like Cardura XL  9. Colon cancer screening - amb ref to GI  - follow up to be determined by labs/ medication   This visit occurred during the SARS-CoV-2 public health emergency.  Safety protocols were in place, including screening questions prior to the visit, additional usage of staff PPE, and extensive cleaning of exam room while observing appropriate contact time as indicated for disinfecting solutions.      Olean Ree, FNP-BC  [facility],  [facility]  12/04/2019 10:33 AM

## 2019-12-03 ENCOUNTER — Encounter: Payer: Self-pay | Admitting: Family Medicine

## 2019-12-04 ENCOUNTER — Encounter: Payer: Self-pay | Admitting: Family Medicine

## 2019-12-04 ENCOUNTER — Other Ambulatory Visit: Payer: Self-pay | Admitting: Family Medicine

## 2019-12-04 DIAGNOSIS — N401 Enlarged prostate with lower urinary tract symptoms: Secondary | ICD-10-CM

## 2019-12-04 DIAGNOSIS — I10 Essential (primary) hypertension: Secondary | ICD-10-CM | POA: Insufficient documentation

## 2019-12-04 DIAGNOSIS — M25522 Pain in left elbow: Secondary | ICD-10-CM | POA: Insufficient documentation

## 2019-12-04 DIAGNOSIS — R251 Tremor, unspecified: Secondary | ICD-10-CM

## 2019-12-04 MED ORDER — PROPRANOLOL HCL 40 MG PO TABS: 40.0000 mg | ORAL_TABLET | Freq: Two times a day (BID) | ORAL | 1 refills | Status: DC | PRN

## 2019-12-04 MED ORDER — TERAZOSIN HCL 1 MG PO CAPS: ORAL_CAPSULE | ORAL | 1 refills | Status: DC

## 2019-12-09 ENCOUNTER — Ambulatory Visit: Payer: BLUE CROSS/BLUE SHIELD | Admitting: Family Medicine

## 2020-01-31 ENCOUNTER — Other Ambulatory Visit: Payer: Self-pay | Admitting: Family Medicine

## 2020-01-31 DIAGNOSIS — N401 Enlarged prostate with lower urinary tract symptoms: Secondary | ICD-10-CM

## 2020-01-31 DIAGNOSIS — I10 Essential (primary) hypertension: Secondary | ICD-10-CM

## 2020-02-02 ENCOUNTER — Other Ambulatory Visit: Payer: Self-pay

## 2020-02-02 DIAGNOSIS — I10 Essential (primary) hypertension: Secondary | ICD-10-CM

## 2020-02-02 DIAGNOSIS — N401 Enlarged prostate with lower urinary tract symptoms: Secondary | ICD-10-CM

## 2020-02-02 MED ORDER — TERAZOSIN HCL 1 MG PO CAPS: ORAL_CAPSULE | ORAL | 0 refills | Status: DC

## 2020-02-02 NOTE — Telephone Encounter (Signed)
Received faxed from [facility] stating insurance will only cover 3 month supply.

## 2020-02-03 ENCOUNTER — Encounter: Payer: Self-pay | Admitting: Family Medicine

## 2020-03-28 ENCOUNTER — Telehealth: Payer: Self-pay

## 2020-03-28 ENCOUNTER — Other Ambulatory Visit: Payer: Self-pay | Admitting: Family Medicine

## 2020-03-28 DIAGNOSIS — N401 Enlarged prostate with lower urinary tract symptoms: Secondary | ICD-10-CM

## 2020-03-28 DIAGNOSIS — I10 Essential (primary) hypertension: Secondary | ICD-10-CM

## 2020-03-28 MED ORDER — TERAZOSIN HCL 5 MG PO CAPS: 5.0000 mg | ORAL_CAPSULE | Freq: Every day | ORAL | 1 refills | Status: DC

## 2020-03-28 NOTE — Telephone Encounter (Signed)
Refill sent to patients pharmacy. 

## 2020-03-28 NOTE — Telephone Encounter (Signed)
Received fax from pharmacy states that patient is currently taking 5 mg.  BP low: 124/64 High 158/80 10/25 145/76 10/28 124/64 11/1 138/80 11/2 127/71 11/3 129/73 11/6 146/86

## 2020-05-01 ENCOUNTER — Other Ambulatory Visit: Payer: Self-pay | Admitting: Family Medicine

## 2020-06-15 ENCOUNTER — Other Ambulatory Visit: Payer: Self-pay

## 2020-06-15 DIAGNOSIS — R251 Tremor, unspecified: Secondary | ICD-10-CM

## 2020-06-15 NOTE — Telephone Encounter (Signed)
Received faxed refill request from [facility], however, [facility] is only pharmacy in pt's chart.  Lvm asking pt to call back letting us know which pharmacy he uses so we can send refill since he scheduled TOC visit with Dr. Reece Agar.

## 2020-06-16 MED ORDER — PROPRANOLOL HCL 40 MG PO TABS: 40.0000 mg | ORAL_TABLET | Freq: Two times a day (BID) | ORAL | 0 refills | Status: DC | PRN

## 2020-06-16 NOTE — Telephone Encounter (Signed)
Left message on vm per dpr notifying pt rx was sent to [facility].

## 2020-07-12 ENCOUNTER — Encounter: Payer: Self-pay | Admitting: Family Medicine

## 2020-07-12 ENCOUNTER — Ambulatory Visit (INDEPENDENT_AMBULATORY_CARE_PROVIDER_SITE_OTHER): Payer: No Typology Code available for payment source | Admitting: Family Medicine

## 2020-07-12 ENCOUNTER — Other Ambulatory Visit: Payer: Self-pay

## 2020-07-12 VITALS — BP 140/76 | HR 82 | Temp 97.7°F | Ht 70.0 in | Wt 211.2 lb

## 2020-07-12 DIAGNOSIS — R3912 Poor urinary stream: Secondary | ICD-10-CM | POA: Diagnosis not present

## 2020-07-12 DIAGNOSIS — N401 Enlarged prostate with lower urinary tract symptoms: Secondary | ICD-10-CM | POA: Diagnosis not present

## 2020-07-12 DIAGNOSIS — R251 Tremor, unspecified: Secondary | ICD-10-CM

## 2020-07-12 DIAGNOSIS — I10 Essential (primary) hypertension: Secondary | ICD-10-CM

## 2020-07-12 DIAGNOSIS — R21 Rash and other nonspecific skin eruption: Secondary | ICD-10-CM | POA: Insufficient documentation

## 2020-07-12 LAB — POCT SKIN KOH: Skin KOH, POC: NEGATIVE

## 2020-07-12 MED ORDER — AMLODIPINE BESYLATE 2.5 MG PO TABS: 2.5000 mg | ORAL_TABLET | Freq: Every day | ORAL | 1 refills | Status: DC

## 2020-07-12 MED ORDER — TRIAMCINOLONE ACETONIDE 0.1 % EX CREA: 1.0000 "application " | TOPICAL_CREAM | Freq: Two times a day (BID) | CUTANEOUS | 0 refills | Status: AC

## 2020-07-12 NOTE — Progress Notes (Signed)
Patient ID: Joel Morales, male    DOB: 1961/01/06, 60 y.o.   MRN: [id number]  This visit was conducted in person.  BP 140/76   Pulse 82   Temp 97.7 F (36.5 C) (Temporal)   Ht 5\' 10"  (1.778 m)   Wt 211 lb 4 oz (95.8 kg)   SpO2 98%   BMI 30.31 kg/m    CC: transfer of care, 77mo HTN f/u visit  Subjective:   HPI: Joel Morales is a 60 y.o. male presenting on 07/12/2020 for Transitions Of Care (TOC from [place]. )   Previously saw Joel Morales, transferring care today.  Last CPE 12/02/2019.   HTN - Compliant with current antihypertensive regimen of terazosin 5mg  daily. Does check blood pressures at home: 150s systolic - averaging 145/80. Orthostatic dizziness with lisinopril 10mg . No low blood pressure readings or symptoms of dizziness/syncope. Denies HA, vision changes, CP/tightness, SOB, leg swelling.   Also on propranolol 40mg  1/2-1 tab BID PRN tremor - takes during work days.   BPH - terazosin has helped symptoms.   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    New rash to bilateral sides starts below ribcage, worse with hot water, itchy, no scaling. Splotchy patches. Not too bad currently. ?eczema. Has tried treating with 1% hydrocortisone without much effect, as well as lamisil cream x3 days. Has not seen derm since 2019. No new lotions, detergents, soaps or shampoos.     Relevant past medical, surgical, family and social history reviewed and updated as indicated. Interim medical history since our last visit reviewed. Allergies and medications reviewed and updated. Outpatient Medications Prior to Visit  Medication Sig Dispense Refill  . ASHWAGANDHA PO Take by mouth.    . diphenhydrAMINE (BENADRYL) 25 MG tablet Take 25 mg every 6 (six) hours as needed by mouth.    . fluticasone (FLONASE) 50 MCG/ACT nasal spray Place daily into both nostrils.    levocetirizine (XYZAL) 5 MG tablet Take 5 mg by mouth every evening.    . Melatonin 10 MG TABS Take by mouth.    . propranolol (INDERAL) 40  MG tablet Take 1 tablet (40 mg total) by mouth 2 (two) times daily as needed. 60 tablet 0  . terazosin (HYTRIN) 5 MG capsule Take 1 capsule (5 mg total) by mouth at bedtime. 90 capsule 1  . Turmeric 500 MG CAPS Take by mouth daily. Only takes in the morning    . terazosin (HYTRIN) 1 MG capsule TAKE 1 CAPSULE AT BEDTIME X5 DAYS, THEN INCREASE TO 2 AT BEDTIME 180 capsule 1   No facility-administered medications prior to visit.     Per HPI unless specifically indicated in ROS section below Review of Systems Objective:  BP 140/76   Pulse 82   Temp 97.7 F (36.5 C) (Temporal)   Ht 5\' 10"  (1.778 m)   Wt 211 lb 4 oz (95.8 kg)   SpO2 98%   BMI 30.31 kg/m   Wt Readings from Last 3 Encounters:  07/12/20 211 lb 4 oz (95.8 kg)  12/02/19 210 lb 6.4 oz (95.4 kg)  04/02/17 219 lb 3.2 oz (99.4 kg)      Physical Exam Vitals and nursing note reviewed.  Constitutional:      Appearance: Normal appearance. He is not ill-appearing.  Eyes:     Extraocular Movements: Extraocular movements intact.     Conjunctiva/sclera: Conjunctivae normal.     Pupils: Pupils are equal, round, and reactive to light.  Neck:  Thyroid: No thyroid mass or thyromegaly.  Cardiovascular:     Rate and Rhythm: Normal rate and regular rhythm.     Pulses: Normal pulses.     Heart sounds: Normal heart sounds. No murmur heard.   Pulmonary:     Effort: Pulmonary effort is normal. No respiratory distress.     Breath sounds: Normal breath sounds. No wheezing, rhonchi or rales.  Musculoskeletal:     Right lower leg: No edema.     Left lower leg: No edema.  Skin:    General: Skin is warm and dry.     Findings: Rash present. No erythema. Rash is macular. Rash is not urticarial or vesicular.     Nails: There is no clubbing.     Comments:  Faint hyperpigmented macular rash to bilateral sides L>R, slightly scaly and pruritic  Skin scrapings collected for KOH  Neurological:     Mental Status: He is alert.  Psychiatric:         Mood and Affect: Mood normal.        Behavior: Behavior normal.       Results for orders placed or performed in visit on 07/12/20  POCT Skin KOH  Result Value Ref Range   Skin KOH, POC Negative Negative    Lab Results  Component Value Date   TSH 0.57 12/02/2019    Lab Results  Component Value Date   CREATININE 1.11 12/02/2019   BUN 11 12/02/2019   NA 138 12/02/2019   K 4.2 12/02/2019   CL 102 12/02/2019   CO2 31 12/02/2019    Assessment & Plan:  This visit occurred during the SARS-CoV-2 public health emergency.  Safety protocols were in place, including screening questions prior to the visit, additional usage of staff PPE, and extensive cleaning of exam room while observing appropriate contact time as indicated for disinfecting solutions.   Problem List Items Addressed This Visit    Tremor    Presumed essential tremor, treating with propranolol 40mg 1/2 tab BID PRN.       Skin rash    KOH skin scrapings without signs of fungal infection, this in setting of recently starting lamisil. rec complete 2 wk lamisil course and if no improvement, will Rx more potent topical steroid triamcinolone cream. Update if ongoing after this.       Relevant Orders   POCT Skin KOH (Completed)   Essential hypertension - Primary    BP stable today however remains high at home - averaging 145/80s. Interested in better control. ACEI previously helped blood pressures but caused dizziness. Will trial low dose amlodipine 2.5mg  daily, monitoring for ankle swelling or flushing side effects. Discussed possible need to taper dose of terazosin if BP readings start dropping. Update with effect of medication.       Relevant Medications   amLODipine (NORVASC) 2.5 MG tablet   Benign prostatic hyperplasia with weak urinary stream    H/o this, doing better on terazosin 5mg  daily.  Reassess at CPE.           Meds ordered this encounter  Medications  . amLODipine (NORVASC) 2.5 MG tablet    Sig:  Take 1 tablet (2.5 mg total) by mouth daily.    Dispense:  90 tablet    Refill:  1  . triamcinolone (KENALOG) 0.1 %    Sig: Apply 1 application topically 2 (two) times daily. Apply to AA.    Dispense:  45 g    Refill:  0   Orders  Placed This Encounter  Procedures  . POCT Skin KOH    Patient Instructions  Blood pressure is staying too high at home - start amlodipine 2.5mg  daily. May need to drop terazosin dose if blood pressures start dropping. Keep me updated with BP readings.  For rash - I don't see yeast on KOH prep. I do recommend full course of lamisil topically and if no better after 2 weeks then try steroid cream sent to pharmacy.   Follow up plan: Return in about 6 months (around 01/09/2021), or if symptoms worsen or fail to improve, for annual exam, prior fasting for blood work.  Eustaquio Boyden, MD

## 2020-07-12 NOTE — Assessment & Plan Note (Signed)
KOH skin scrapings without signs of fungal infection, this in setting of recently starting lamisil. rec complete 2 wk lamisil course and if no improvement, will Rx more potent topical steroid triamcinolone cream. Update if ongoing after this.

## 2020-07-12 NOTE — Assessment & Plan Note (Signed)
Presumed essential tremor, treating with propranolol 40mg 1/2 tab BID PRN.

## 2020-07-12 NOTE — Patient Instructions (Signed)
Blood pressure is staying too high at home - start amlodipine 2.5mg  daily. May need to drop terazosin dose if blood pressures start dropping. Keep me updated with BP readings.  For rash - I don't see yeast on KOH prep. I do recommend full course of lamisil topically and if no better after 2 weeks then try steroid cream sent to pharmacy.

## 2020-07-12 NOTE — Assessment & Plan Note (Signed)
BP stable today however remains high at home - averaging 145/80s. Interested in better control. ACEI previously helped blood pressures but caused dizziness. Will trial low dose amlodipine 2.5mg  daily, monitoring for ankle swelling or flushing side effects. Discussed possible need to taper dose of terazosin if BP readings start dropping. Update with effect of medication.

## 2020-07-12 NOTE — Assessment & Plan Note (Signed)
H/o this, doing better on terazosin 5mg  daily.  Reassess at CPE.

## 2020-07-30 ENCOUNTER — Encounter: Payer: Self-pay | Admitting: Family Medicine

## 2020-08-04 MED ORDER — AMLODIPINE BESYLATE 5 MG PO TABS: 5.0000 mg | ORAL_TABLET | Freq: Every day | ORAL | 3 refills | Status: DC

## 2020-08-12 ENCOUNTER — Other Ambulatory Visit: Payer: Self-pay | Admitting: *Deleted

## 2020-08-12 DIAGNOSIS — R251 Tremor, unspecified: Secondary | ICD-10-CM

## 2020-08-12 MED ORDER — PROPRANOLOL HCL 40 MG PO TABS: 40.0000 mg | ORAL_TABLET | Freq: Two times a day (BID) | ORAL | 0 refills | Status: DC | PRN

## 2020-08-12 NOTE — Telephone Encounter (Signed)
Received [facility] from [facility] stating insurance requires a 90 day supply.

## 2020-09-23 ENCOUNTER — Other Ambulatory Visit: Payer: Self-pay

## 2020-09-23 DIAGNOSIS — I10 Essential (primary) hypertension: Secondary | ICD-10-CM

## 2020-09-23 DIAGNOSIS — N401 Enlarged prostate with lower urinary tract symptoms: Secondary | ICD-10-CM

## 2020-09-23 MED ORDER — TERAZOSIN HCL 5 MG PO CAPS: 5.0000 mg | ORAL_CAPSULE | Freq: Every day | ORAL | 0 refills | Status: DC

## 2020-09-23 NOTE — Telephone Encounter (Signed)
Received faxed refill request.  E-scribed refill.  

## 2020-10-10 DIAGNOSIS — Z0100 Encounter for examination of eyes and vision without abnormal findings: Secondary | ICD-10-CM | POA: Diagnosis not present

## 2020-10-10 DIAGNOSIS — H5213 Myopia, bilateral: Secondary | ICD-10-CM | POA: Diagnosis not present

## 2020-11-26 ENCOUNTER — Other Ambulatory Visit: Payer: Self-pay | Admitting: Family Medicine

## 2020-11-26 DIAGNOSIS — I10 Essential (primary) hypertension: Secondary | ICD-10-CM

## 2020-11-26 DIAGNOSIS — R3912 Poor urinary stream: Secondary | ICD-10-CM

## 2020-11-26 DIAGNOSIS — N401 Enlarged prostate with lower urinary tract symptoms: Secondary | ICD-10-CM

## 2020-11-30 ENCOUNTER — Other Ambulatory Visit (INDEPENDENT_AMBULATORY_CARE_PROVIDER_SITE_OTHER): Payer: No Typology Code available for payment source

## 2020-11-30 ENCOUNTER — Other Ambulatory Visit: Payer: Self-pay

## 2020-11-30 DIAGNOSIS — N401 Enlarged prostate with lower urinary tract symptoms: Secondary | ICD-10-CM | POA: Diagnosis not present

## 2020-11-30 DIAGNOSIS — R3912 Poor urinary stream: Secondary | ICD-10-CM | POA: Diagnosis not present

## 2020-11-30 DIAGNOSIS — I10 Essential (primary) hypertension: Secondary | ICD-10-CM | POA: Diagnosis not present

## 2020-11-30 LAB — LIPID PANEL
Cholesterol: 158 mg/dL (ref 0–200)
HDL: 46.6 mg/dL (ref >39.00)
LDL Cholesterol: 103 mg/dL — ABNORMAL HIGH (ref 0–99)
NonHDL: 111.7
Total CHOL/HDL Ratio: 3
Triglycerides: 42 mg/dL (ref 0.0–149.0)
VLDL: 8.4 mg/dL (ref 0.0–40.0)

## 2020-11-30 LAB — BASIC METABOLIC PANEL
BUN: 11 mg/dL (ref 6–23)
CO2: 28 mEq/L (ref 19–32)
Calcium: 9.3 mg/dL (ref 8.4–10.5)
Chloride: 105 mEq/L (ref 96–112)
Creatinine, Ser: 1.08 mg/dL (ref 0.40–1.50)
GFR: 74.29 mL/min (ref >60.00)
Glucose, Bld: 98 mg/dL (ref 70–99)
Potassium: 4.2 mEq/L (ref 3.5–5.1)
Sodium: 140 mEq/L (ref 135–145)

## 2020-11-30 LAB — MICROALBUMIN / CREATININE URINE RATIO
Creatinine,U: 143.1 mg/dL
Microalb Creat Ratio: 0.5 mg/g (ref 0.0–30.0)
Microalb, Ur: <0.7 mg/dL (ref 0.0–1.9)

## 2020-12-01 LAB — PSA, TOTAL AND FREE
PSA, % Free: 24 % (calc) — ABNORMAL LOW (ref >25)
PSA, Free: 1 ng/mL
PSA, Total: 4.2 ng/mL — ABNORMAL HIGH (ref <=4.0)

## 2020-12-06 ENCOUNTER — Encounter: Payer: Self-pay | Admitting: Family Medicine

## 2020-12-06 ENCOUNTER — Ambulatory Visit (INDEPENDENT_AMBULATORY_CARE_PROVIDER_SITE_OTHER): Payer: No Typology Code available for payment source | Admitting: Family Medicine

## 2020-12-06 ENCOUNTER — Other Ambulatory Visit: Payer: Self-pay

## 2020-12-06 VITALS — BP 138/70 | HR 86 | Temp 98.2°F | Ht 70.0 in | Wt 213.5 lb

## 2020-12-06 DIAGNOSIS — I10 Essential (primary) hypertension: Secondary | ICD-10-CM | POA: Diagnosis not present

## 2020-12-06 DIAGNOSIS — N401 Enlarged prostate with lower urinary tract symptoms: Secondary | ICD-10-CM

## 2020-12-06 DIAGNOSIS — Z0000 Encounter for general adult medical examination without abnormal findings: Secondary | ICD-10-CM | POA: Insufficient documentation

## 2020-12-06 DIAGNOSIS — R3912 Poor urinary stream: Secondary | ICD-10-CM

## 2020-12-06 DIAGNOSIS — R251 Tremor, unspecified: Secondary | ICD-10-CM | POA: Diagnosis not present

## 2020-12-06 MED ORDER — TERAZOSIN HCL 5 MG PO CAPS: 5.0000 mg | ORAL_CAPSULE | Freq: Every day | ORAL | 3 refills | Status: DC

## 2020-12-06 MED ORDER — LOSARTAN POTASSIUM 50 MG PO TABS: 50.0000 mg | ORAL_TABLET | Freq: Every day | ORAL | 1 refills | Status: DC

## 2020-12-06 NOTE — Assessment & Plan Note (Addendum)
Chronic, stable period on terazosin - continue.  PSA mildly elevated, stable over the years most consistent with BPH as well.

## 2020-12-06 NOTE — Assessment & Plan Note (Signed)
Chronic, overall stable however with possible mild leg swelling side effect will change antihypertensive regimen to losartan 50mg  daily in place of amlodipine. RTC 1 wk Cr check, RTC 3 mo HTN f/u visit. Pt agrees with plan.

## 2020-12-06 NOTE — Assessment & Plan Note (Signed)
Preventative protocols reviewed and updated unless pt declined. Discussed healthy diet and lifestyle.  

## 2020-12-06 NOTE — Progress Notes (Signed)
Patient ID: Joel Morales, male    DOB: 1961/02/04, 60 y.o.   MRN: [id number]  This visit was conducted in person.  BP 138/70 (BP Location: Right Arm, Cuff Size: Large)   Pulse 86   Temp 98.2 F (36.8 C) (Temporal)   Ht 5\' 10"  (1.778 m)   Wt 213 lb 8 oz (96.8 kg)   SpO2 99%   BMI 30.63 kg/m    CC: CPE Subjective:   HPI: Joel Morales is a 59 y.o. male presenting on 12/06/2020 for Annual Exam (CPE.  C/o having sweling in feet off/on. )   BP elevated despite amlodipine 5mg  daily and propranolol 40mg  bid PRN tremor - notices leg swelling since increasing amlodipine dose. Home reading this morning 145/74. Takes propranolol on work days, without significant change in blood pressures noted.   Preventative: Colonoscopy - benign polyps, rpt 5 yrs 12/08/2020) ~2016 Prostate cancer screening - see below. H/o BPH on terazosin. Nocturia x1, strong stream. No fmhx prostate cancer.  Lung cancer screening - not eligible  Flu shot - yearly COVID - J&J 08/2019, Pfizer 03/2020  Tdap 2016 Pneumonia shot - not due Shingrix - to consider  Advanced directive discussion -  Seat belt use discussed Sunscreen use discussed. No changing moles on skin. Sees derm. 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 Interim medical history since our last visit reviewed. Allergies and medications reviewed and updated. Outpatient Medications Prior to Visit  Medication Sig Dispense Refill   cholecalciferol (VITAMIN D3) 25 MCG (1000 UNIT) tablet Take 1,000 Units by mouth daily.     diphenhydrAMINE (BENADRYL) 25 MG tablet Take 25 mg every 6 (six) hours as needed by mouth.     fluticasone (FLONASE) 50 MCG/ACT nasal spray Place daily into both  nostrils.     levocetirizine (XYZAL) 5 MG tablet Take 5 mg by mouth every evening.     Melatonin 10 MG TABS Take by mouth.     propranolol (INDERAL) 40 MG tablet Take 1 tablet (40 mg total) by mouth 2 (two) times daily as needed. 180 tablet 0   Turmeric 500 MG CAPS Take by mouth daily. Only takes in the morning     amLODipine (NORVASC) 5 MG tablet Take 1 tablet (5 mg total) by mouth daily. 90 tablet 3   terazosin (HYTRIN) 5 MG capsule Take 1 capsule (5 mg total) by mouth at bedtime. 90 capsule 0   ASHWAGANDHA PO Take by mouth.     triamcinolone (KENALOG) 0.1 % Apply 1 application topically 2 (two) times daily. Apply to AA. (Patient not taking: Reported on 12/06/2020) 45 g 0   No facility-administered medications prior to visit.     Per HPI unless specifically indicated in ROS section below Review of Systems  Constitutional:  Negative for activity change, appetite change, chills, fatigue, fever and unexpected weight change.  HENT:  Negative for hearing loss.   Eyes:  Negative for visual disturbance.  Respiratory:  Negative for cough, chest tightness and wheezing.   Cardiovascular:  Positive for leg swelling (mild  see HPI). Negative for chest pain and palpitations.  Gastrointestinal:  Negative for abdominal distention, abdominal pain, blood in stool, constipation, diarrhea, nausea and  vomiting.  Genitourinary:  Negative for difficulty urinating and hematuria.  Musculoskeletal:  Negative for arthralgias, myalgias and neck pain.  Skin:  Negative for rash.  Neurological:  Negative for dizziness, seizures, syncope and headaches.  Hematological:  Negative for adenopathy. Does not bruise/bleed easily.  Psychiatric/Behavioral:  Negative for dysphoric mood. The patient is not nervous/anxious.    Objective:  BP 138/70 (BP Location: Right Arm, Cuff Size: Large)   Pulse 86   Temp 98.2 F (36.8 C) (Temporal)   Ht 5\' 10"  (1.778 m)   Wt 213 lb 8 oz (96.8 kg)   SpO2 99%   BMI 30.63 kg/m   Wt  Readings from Last 3 Encounters:  12/06/20 213 lb 8 oz (96.8 kg)  07/12/20 211 lb 4 oz (95.8 kg)  12/02/19 210 lb 6.4 oz (95.4 kg)      Physical Exam Vitals and nursing note reviewed.  Constitutional:      General: He is not in acute distress.    Appearance: Normal appearance. He is well-developed. He is not ill-appearing.  HENT:     Head: Normocephalic and atraumatic.     Right Ear: Hearing, tympanic membrane, ear canal and external ear normal.     Left Ear: Hearing, tympanic membrane, ear canal and external ear normal.  Eyes:     General: No scleral icterus.    Extraocular Movements: Extraocular movements intact.     Conjunctiva/sclera: Conjunctivae normal.     Pupils: Pupils are equal, round, and reactive to light.  Neck:     Thyroid: No thyroid mass or thyromegaly.     Vascular: No carotid bruit.  Cardiovascular:     Rate and Rhythm: Normal rate and regular rhythm.     Pulses: Normal pulses.          Radial pulses are 2+ on the right side and 2+ on the left side.     Heart sounds: Normal heart sounds. No murmur heard. Pulmonary:     Effort: Pulmonary effort is normal. No respiratory distress.     Breath sounds: Normal breath sounds. No wheezing, rhonchi or rales.  Abdominal:     General: Bowel sounds are normal. There is no distension.     Palpations: Abdomen is soft. There is no mass.     Tenderness: There is no abdominal tenderness. There is no guarding or rebound.     Hernia: No hernia is present.  Genitourinary:    Prostate: Enlarged (30gm). Not tender and no nodules present.     Rectum: Normal. No mass, tenderness, anal fissure, external hemorrhoid or internal hemorrhoid. Normal anal tone.  Musculoskeletal:        General: Normal range of motion.     Cervical back: Normal range of motion and neck supple.     Right lower leg: Edema (tr) present.     Left lower leg: Edema (tr) present.  Lymphadenopathy:     Cervical: No cervical adenopathy.  Skin:    General:  Skin is warm and dry.     Findings: No rash.  Neurological:     General: No focal deficit present.     Mental Status: He is alert and oriented to person, place, and time.  Psychiatric:        Mood and Affect: Mood normal.        Behavior: Behavior normal.        Thought Content: Thought content normal.        Judgment: Judgment normal.  Results for orders placed or performed in visit on 11/30/20  Basic metabolic panel  Result Value Ref Range   Sodium 140 135 - 145 mEq/L   Potassium 4.2 3.5 - 5.1 mEq/L   Chloride 105 96 - 112 mEq/L   CO2 28 19 - 32 mEq/L   Glucose, Bld 98 70 - 99 mg/dL   BUN 11 6 - 23 mg/dL   Creatinine, Ser 5.18 0.40 - 1.50 mg/dL   GFR 84.16 >60.63 mL/min   Calcium 9.3 8.4 - 10.5 mg/dL  PSA, total and free  Result Value Ref Range   PSA, Total 4.2 (H) < OR = 4.0 ng/mL   PSA, Free 1.0 ng/mL   PSA, % Free 24 (L) >25 % (calc)  Microalbumin / creatinine urine ratio  Result Value Ref Range   Microalb, Ur <0.7 0.0 - 1.9 mg/dL   Creatinine,U 016.0 mg/dL   Microalb Creat Ratio 0.5 0.0 - 30.0 mg/g  Lipid panel  Result Value Ref Range   Cholesterol 158 0 - 200 mg/dL   Triglycerides 10.9 0.0 - 149.0 mg/dL   HDL 32.35 >57.32 mg/dL   VLDL 8.4 0.0 - 20.2 mg/dL   LDL Cholesterol 542 (H) 0 - 99 mg/dL   Total CHOL/HDL Ratio 3    NonHDL 111.70    Lab Results  Component Value Date   PSA 4.11 (H) 12/02/2019   PSA 4.87 (H) 08/31/2014     Assessment & Plan:  This visit occurred during the SARS-CoV-2 public health emergency.  Safety protocols were in place, including screening questions prior to the visit, additional usage of staff PPE, and extensive cleaning of exam room while observing appropriate contact time as indicated for disinfecting solutions.   Problem List Items Addressed This Visit     Benign prostatic hyperplasia with weak urinary stream    Chronic, stable period on terazosin - continue.  PSA mildly elevated, stable over the years most consistent  with BPH as well.        Relevant Medications   terazosin (HYTRIN) 5 MG capsule   Essential hypertension    Chronic, overall stable however with possible mild leg swelling side effect will change antihypertensive regimen to losartan 50mg  daily in place of amlodipine. RTC 1 wk Cr check, RTC 3 mo HTN f/u visit. Pt agrees with plan.        Relevant Medications   losartan (COZAAR) 50 MG tablet   terazosin (HYTRIN) 5 MG capsule   Other Relevant Orders   Basic metabolic panel   Tremor    Continue propranolol 40mg  BID PRN.        Health maintenance examination - Primary    Preventative protocols reviewed and updated unless pt declined. Discussed healthy diet and lifestyle.          Meds ordered this encounter  Medications   losartan (COZAAR) 50 MG tablet    Sig: Take 1 tablet (50 mg total) by mouth daily.    Dispense:  90 tablet    Refill:  1   terazosin (HYTRIN) 5 MG capsule    Sig: Take 1 capsule (5 mg total) by mouth at bedtime.    Dispense:  90 capsule    Refill:  3   Orders Placed This Encounter  Procedures   Basic metabolic panel    Standing Status:   Future    Standing Expiration Date:   12/06/2021     Patient instructions: Switch BP med to losartan 50mg  daily. Hold amlodipine while you  try losartan on its own, if needed may add back amlodipine 1/2-1 tablet daily. Goal blood pressures <14/90.  We will request latest colonoscopy by Dr Elnoria Howard - call him to schedule repeat if due.  You are doing well today Return as needed or in 3-4 mo for hypertension follow up   Follow up plan: Return in about 3 months (around 03/08/2021) for follow up visit.  Eustaquio Boyden, MD

## 2020-12-06 NOTE — Patient Instructions (Addendum)
Switch BP med to losartan 50mg  daily. Hold amlodipine while you try losartan on its own, if needed may add back amlodipine 1/2-1 tablet daily. Goal blood pressures <14/90.  We will request latest colonoscopy by Dr - call him to schedule repeat if due.  You are doing well today Return as needed or in 3-4 mo for hypertension follow up   Health Maintenance, Male Adopting a healthy lifestyle and getting preventive care are important in promoting health and wellness. Ask your health care provider about: The right schedule for you to have regular tests and exams. Things you can do on your own to prevent diseases and keep yourself healthy. What should I know about diet, weight, and exercise? Eat a healthy diet  Eat a diet that includes plenty of vegetables, fruits, low-fat dairy products, and lean protein. Do not eat a lot of foods that are high in solid fats, added sugars, or sodium.  Maintain a healthy weight Body mass index (BMI) is a measurement that can be used to identify possible weight problems. It estimates body fat based on height and weight. Your health care provider can help determine your BMI and help you achieve or maintain ahealthy weight. Get regular exercise Get regular exercise. This is one of the most important things you can do for your health. Most adults should: Exercise for at least 150 minutes each week. The exercise should increase your heart rate and make you sweat (moderate-intensity exercise). Do strengthening exercises at least twice a week. This is in addition to the moderate-intensity exercise. Spend less time sitting. Even light physical activity can be beneficial. Watch cholesterol and blood lipids Have your blood tested for lipids and cholesterol at 61 years of age, then havethis test every 5 years. You may need to have your cholesterol levels checked more often if: Your lipid or cholesterol levels are high. You are older than 61 years of age. You are at  high risk for heart disease. What should I know about cancer screening? Many types of cancers can be detected early and may often be prevented. Depending on your health history and family history, you may need to have cancer screening at various ages. This may include screening for: Colorectal cancer. Prostate cancer. Skin cancer. Lung cancer. What should I know about heart disease, diabetes, and high blood pressure? Blood pressure and heart disease High blood pressure causes heart disease and increases the risk of stroke. This is more likely to develop in people who have high blood pressure readings, are of African descent, or are overweight. Talk with your health care provider about your target blood pressure readings. Have your blood pressure checked: Every 3-5 years if you are 45-36 years of age. Every year if you are 84 years old or older. If you are between the ages of 37 and 14 and are a current or former smoker, ask your health care provider if you should have a one-time screening for abdominal aortic aneurysm (AAA). Diabetes Have regular diabetes screenings. This checks your fasting blood sugar level. Have the screening done: Once every three years after age 60 if you are at a normal weight and have a low risk for diabetes. More often and at a younger age if you are overweight or have a high risk for diabetes. What should I know about preventing infection? Hepatitis B If you have a higher risk for hepatitis B, you should be screened for this virus. Talk with your health care provider to find out if  you are at risk forhepatitis B infection. Hepatitis C Blood testing is recommended for: Everyone born from [birth year] through 1966. Anyone with known risk factors for hepatitis C. Sexually transmitted infections (STIs) You should be screened each year for STIs, including gonorrhea and chlamydia, if: You are sexually active and are younger than 61 years of age. You are older than 61 years of  age and your health care provider tells you that you are at risk for this type of infection. Your sexual activity has changed since you were last screened, and you are at increased risk for chlamydia or gonorrhea. Ask your health care provider if you are at risk. Ask your health care provider about whether you are at high risk for HIV. Your health care provider may recommend a prescription medicine to help prevent HIV infection. If you choose to take medicine to prevent HIV, you should first get tested for HIV. You should then be tested every 3 months for as long as you are taking the medicine. Follow these instructions at home: Lifestyle Do not use any products that contain nicotine or tobacco, such as cigarettes, e-cigarettes, and chewing tobacco. If you need help quitting, ask your health care provider. Do not use street drugs. Do not share needles. Ask your health care provider for help if you need support or information about quitting drugs. Alcohol use Do not drink alcohol if your health care provider tells you not to drink. If you drink alcohol: Limit how much you have to 0-2 drinks a day. Be aware of how much alcohol is in your drink. In the [place] one drink equals one 12 oz bottle of beer (355 mL), one 5 oz glass of wine (148 mL), or one 1 oz glass of hard liquor (44 mL). General instructions Schedule regular health, dental, and eye exams. Stay current with your vaccines. Tell your health care provider if: You often feel depressed. You have ever been abused or do not feel safe at home. Summary Adopting a healthy lifestyle and getting preventive care are important in promoting health and wellness. Follow your health care provider's instructions about healthy diet, exercising, and getting tested or screened for diseases. Follow your health care provider's instructions on monitoring your cholesterol and blood pressure. This information is not intended to replace advice given to you by  your health care provider. Make sure you discuss any questions you have with your healthcare provider. Document Revised: 04/30/2018 Document Reviewed: 04/30/2018 Elsevier Patient Education  2022 [employer].

## 2020-12-06 NOTE — Assessment & Plan Note (Signed)
Continue propranolol 40mg  BID PRN.

## 2021-05-31 ENCOUNTER — Other Ambulatory Visit: Payer: Self-pay

## 2021-05-31 MED ORDER — LOSARTAN POTASSIUM 50 MG PO TABS: 50.0000 mg | ORAL_TABLET | Freq: Every day | ORAL | 0 refills | Status: DC

## 2021-06-01 ENCOUNTER — Other Ambulatory Visit: Payer: Self-pay | Admitting: Family Medicine

## 2021-06-11 ENCOUNTER — Other Ambulatory Visit: Payer: Self-pay | Admitting: Family Medicine

## 2021-06-11 DIAGNOSIS — R251 Tremor, unspecified: Secondary | ICD-10-CM

## 2021-06-12 NOTE — Telephone Encounter (Signed)
Lvmtcb to schedule apt °

## 2021-06-12 NOTE — Telephone Encounter (Signed)
Plz schedule BP f/u (overdue).  Then return request to me to send refill.

## 2021-06-15 NOTE — Telephone Encounter (Signed)
E-scribed 30-day refill.  Plz attempt 1 more call, then send letter if no response.

## 2021-06-15 NOTE — Telephone Encounter (Signed)
2nd attempt  LMTCB to schedule BP appt

## 2021-06-16 ENCOUNTER — Encounter: Payer: Self-pay | Admitting: Family Medicine

## 2021-06-20 ENCOUNTER — Encounter: Payer: Self-pay | Admitting: Family Medicine

## 2021-06-20 NOTE — Telephone Encounter (Signed)
Called pt. No answer. Mailing letter

## 2021-06-21 NOTE — Telephone Encounter (Signed)
Pt has HTN f/u on 06/27/21 at 11:30.  E-scribed refill.

## 2021-06-27 ENCOUNTER — Ambulatory Visit: Payer: No Typology Code available for payment source | Admitting: Family Medicine

## 2021-07-23 ENCOUNTER — Other Ambulatory Visit: Payer: Self-pay | Admitting: Family Medicine

## 2021-07-23 DIAGNOSIS — R251 Tremor, unspecified: Secondary | ICD-10-CM

## 2021-07-24 NOTE — Telephone Encounter (Signed)
Propranolol ?Last filled:  06/21/21, #60 ?Last OV:  12/06/20, CPE ?Next OV:  07/31/21, HTN f/u ?

## 2021-07-31 ENCOUNTER — Encounter: Payer: Self-pay | Admitting: Family Medicine

## 2021-07-31 ENCOUNTER — Ambulatory Visit (INDEPENDENT_AMBULATORY_CARE_PROVIDER_SITE_OTHER): Payer: No Typology Code available for payment source | Admitting: Family Medicine

## 2021-07-31 ENCOUNTER — Other Ambulatory Visit: Payer: Self-pay

## 2021-07-31 VITALS — BP 138/70 | HR 60 | Temp 98.0°F | Resp 16 | Ht 70.0 in | Wt 219.0 lb

## 2021-07-31 DIAGNOSIS — R251 Tremor, unspecified: Secondary | ICD-10-CM | POA: Diagnosis not present

## 2021-07-31 DIAGNOSIS — R3912 Poor urinary stream: Secondary | ICD-10-CM

## 2021-07-31 DIAGNOSIS — I10 Essential (primary) hypertension: Secondary | ICD-10-CM

## 2021-07-31 DIAGNOSIS — N401 Enlarged prostate with lower urinary tract symptoms: Secondary | ICD-10-CM

## 2021-07-31 LAB — BASIC METABOLIC PANEL
BUN: 12 mg/dL (ref 6–23)
CO2: 25 mEq/L (ref 19–32)
Calcium: 9.1 mg/dL (ref 8.4–10.5)
Chloride: 108 mEq/L (ref 96–112)
Creatinine, Ser: 1.12 mg/dL (ref 0.40–1.50)
GFR: 70.78 mL/min (ref >60.00)
Glucose, Bld: 104 mg/dL — ABNORMAL HIGH (ref 70–99)
Potassium: 4.6 mEq/L (ref 3.5–5.1)
Sodium: 138 mEq/L (ref 135–145)

## 2021-07-31 MED ORDER — LOSARTAN POTASSIUM 50 MG PO TABS: 50.0000 mg | ORAL_TABLET | Freq: Every day | ORAL | 1 refills | Status: DC

## 2021-07-31 MED ORDER — PROPRANOLOL HCL 40 MG PO TABS: 20.0000 mg | ORAL_TABLET | Freq: Two times a day (BID) | ORAL | 1 refills | Status: DC

## 2021-07-31 NOTE — Assessment & Plan Note (Signed)
He has dropped propranolol to 20mg  BID - predominantly on work days (week days).  ?

## 2021-07-31 NOTE — Progress Notes (Signed)
? ? Patient ID: Joel Morales, male    DOB: [birth year], 61 y.o.   MRN: [id number] ? ?This visit was conducted in person. ? ?BP 138/70   Pulse 60   Temp 98 ?F (36.7 ?C)   Resp 16   Ht 5\' 10"  (1.778 m)   Wt 219 lb (99.3 kg)   SpO2 98%   BMI 31.42 kg/m?   ? ?CC: HTN f/u visit  ?Subjective:  ? ?HPI: ?Joel Morales is a 61 y.o. male presenting on 07/31/2021 for Hypertension (/) ? ? ?HTN - Compliant with current antihypertensive regimen of losartan 50mg  daily. Possible mild leg swelling to amlodipine. He also takes terazosin 5mg  nightly. Does check blood pressures at home 3 times a week - 135/70. No low blood pressure readings or symptoms of dizziness/syncope. Denies HA, vision changes, CP/tightness, SOB. Still occasional leg swelling.  ? ?He also continues propranolol 40mg  bid PRN tremor - takes this 20mg  BID 5d/wk.  ?   ? ?Relevant past medical, surgical, family and social history reviewed and updated as indicated. Interim medical history since our last visit reviewed. ?Allergies and medications reviewed and updated. ?Outpatient Medications Prior to Visit  ?Medication Sig Dispense Refill  ? cholecalciferol (VITAMIN D3) 25 MCG (1000 UNIT) tablet Take 1,000 Units by mouth daily.    ? diphenhydrAMINE (BENADRYL) 25 MG tablet Take 25 mg every 6 (six) hours as needed by mouth.    ? fluticasone (FLONASE) 50 MCG/ACT nasal spray Place daily into both nostrils.    ? levocetirizine (XYZAL) 5 MG tablet Take 5 mg by mouth every evening.    ? Melatonin 10 MG TABS Take by mouth.    ? terazosin (HYTRIN) 5 MG capsule Take 1 capsule (5 mg total) by mouth at bedtime. 90 capsule 3  ? Turmeric 500 MG CAPS Take by mouth daily. Only takes in the morning    ? losartan (COZAAR) 50 MG tablet Take 1 tablet (50 mg total) by mouth daily. 90 tablet 0  ? propranolol (INDERAL) 40 MG tablet TAKE 1 TABLET BY MOUTH TWICE A DAY AS NEEDED 60 tablet 0  ? ?No facility-administered medications prior to visit.  ?  ? ?Per HPI unless specifically  indicated in ROS section below ?Review of Systems ? ?Objective:  ?BP 138/70   Pulse 60   Temp 98 ?F (36.7 ?C)   Resp 16   Ht 5\' 10"  (1.778 m)   Wt 219 lb (99.3 kg)   SpO2 98%   BMI 31.42 kg/m?   ?Wt Readings from Last 3 Encounters:  ?07/31/21 219 lb (99.3 kg)  ?12/06/20 213 lb 8 oz (96.8 kg)  ?07/12/20 211 lb 4 oz (95.8 kg)  ?  ?  ?Physical Exam ?Vitals and nursing note reviewed.  ?Constitutional:   ?   Appearance: Normal appearance. He is not ill-appearing.  ?Eyes:  ?   Extraocular Movements: Extraocular movements intact.  ?   Conjunctiva/sclera: Conjunctivae normal.  ?   Pupils: Pupils are equal, round, and reactive to light.  ?Cardiovascular:  ?   Rate and Rhythm: Normal rate and regular rhythm.  ?   Pulses: Normal pulses.  ?   Heart sounds: Normal heart sounds. No murmur heard. ?Pulmonary:  ?   Effort: Pulmonary effort is normal. No respiratory distress.  ?   Breath sounds: Normal breath sounds. No wheezing, rhonchi or rales.  ?Musculoskeletal:  ?   Right lower leg: No edema.  ?   Left lower leg: No edema.  ?Neurological:  ?  Mental Status: He is alert.  ?Psychiatric:     ?   Mood and Affect: Mood normal.     ?   Behavior: Behavior normal.  ? ?   ?Results for orders placed or performed in visit on 11/30/20  ?Basic metabolic panel  ?Result Value Ref Range  ? Sodium 140 135 - 145 mEq/L  ? Potassium 4.2 3.5 - 5.1 mEq/L  ? Chloride 105 96 - 112 mEq/L  ? CO2 28 19 - 32 mEq/L  ? Glucose, Bld 98 70 - 99 mg/dL  ? BUN 11 6 - 23 mg/dL  ? Creatinine, Ser 1.08 0.40 - 1.50 mg/dL  ? GFR 74.29 >60.00 mL/min  ? Calcium 9.3 8.4 - 10.5 mg/dL  ?PSA, total and free  ?Result Value Ref Range  ? PSA, Total 4.2 (H) < OR = 4.0 ng/mL  ? PSA, Free 1.0 ng/mL  ? PSA, % Free 24 (L) >25 % (calc)  ?Microalbumin / creatinine urine ratio  ?Result Value Ref Range  ? Microalb, Ur <0.7 0.0 - 1.9 mg/dL  ? Creatinine,U 143.1 mg/dL  ? Microalb Creat Ratio 0.5 0.0 - 30.0 mg/g  ?Lipid panel  ?Result Value Ref Range  ? Cholesterol 158 0 - 200  mg/dL  ? Triglycerides 42.0 0.0 - 149.0 mg/dL  ? HDL 46.60 >39.00 mg/dL  ? VLDL 8.4 0.0 - 40.0 mg/dL  ? LDL Cholesterol 103 (H) 0 - 99 mg/dL  ? Total CHOL/HDL Ratio 3   ? NonHDL 111.70   ? ? ?Assessment & Plan:  ?This visit occurred during the SARS-CoV-2 public health emergency.  Safety protocols were in place, including screening questions prior to the visit, additional usage of staff PPE, and extensive cleaning of exam room while observing appropriate contact time as indicated for disinfecting solutions.  ? ?Problem List Items Addressed This Visit   ? ? Benign prostatic hyperplasia with weak urinary stream  ?  Continue nightly terazosin.  ?  ?  ? Essential hypertension - Primary  ?  Chronic, adequate on current regimen - continue.  ?Update Cr on losartan  ?  ?  ? Relevant Medications  ? losartan (COZAAR) 50 MG tablet  ? propranolol (INDERAL) 40 MG tablet  ? Tremor  ?  He has dropped propranolol to 20mg  BID - predominantly on work days (week days).  ?  ?  ? Relevant Medications  ? propranolol (INDERAL) 40 MG tablet  ?  ? ?Meds ordered this encounter  ?Medications  ? losartan (COZAAR) 50 MG tablet  ?  Sig: Take 1 tablet (50 mg total) by mouth daily.  ?  Dispense:  90 tablet  ?  Refill:  1  ? propranolol (INDERAL) 40 MG tablet  ?  Sig: Take 0.5 tablets (20 mg total) by mouth 2 (two) times daily.  ?  Dispense:  90 tablet  ?  Refill:  1  ? ?No orders of the defined types were placed in this encounter. ? ? ? ?Patient Instructions  ?Labs today  ?Touch base with GI about follow up colonoscopy. ?Blood pressures are staying stable - continue current regimen including propranolol and losartan. ?Return as needed or in 5-6 months for physical.  ? ?Follow up plan: ?Return in about 5 months (around 12/31/2021) for annual exam, prior fasting for blood work. ? ?01/02/2022, MD   ?

## 2021-07-31 NOTE — Patient Instructions (Addendum)
Labs today  ?Touch base with GI about follow up colonoscopy. ?Blood pressures are staying stable - continue current regimen including propranolol and losartan. ?Return as needed or in 5-6 months for physical.  ?

## 2021-07-31 NOTE — Assessment & Plan Note (Addendum)
Chronic, adequate on current regimen - continue.  ?Update Cr on losartan  ?

## 2021-07-31 NOTE — Assessment & Plan Note (Signed)
Continue nightly terazosin.  ?

## 2021-08-29 ENCOUNTER — Other Ambulatory Visit: Payer: Self-pay | Admitting: Family Medicine

## 2021-08-29 DIAGNOSIS — I10 Essential (primary) hypertension: Secondary | ICD-10-CM

## 2021-08-29 DIAGNOSIS — N401 Enlarged prostate with lower urinary tract symptoms: Secondary | ICD-10-CM

## 2021-08-31 ENCOUNTER — Other Ambulatory Visit: Payer: Self-pay | Admitting: Family Medicine

## 2021-08-31 DIAGNOSIS — I10 Essential (primary) hypertension: Secondary | ICD-10-CM

## 2021-08-31 DIAGNOSIS — N401 Enlarged prostate with lower urinary tract symptoms: Secondary | ICD-10-CM

## 2021-12-07 ENCOUNTER — Other Ambulatory Visit: Payer: Self-pay | Admitting: Family Medicine

## 2021-12-07 DIAGNOSIS — I10 Essential (primary) hypertension: Secondary | ICD-10-CM

## 2021-12-07 DIAGNOSIS — N401 Enlarged prostate with lower urinary tract symptoms: Secondary | ICD-10-CM

## 2021-12-07 NOTE — Telephone Encounter (Signed)
Please schedule CPE with fasting labs prior with Dr. Sharen Hones for around 12/31/21.

## 2021-12-07 NOTE — Telephone Encounter (Signed)
Patient has been scheduled

## 2021-12-19 ENCOUNTER — Other Ambulatory Visit: Payer: Self-pay | Admitting: Family Medicine

## 2021-12-19 DIAGNOSIS — I10 Essential (primary) hypertension: Secondary | ICD-10-CM

## 2021-12-19 DIAGNOSIS — Z1159 Encounter for screening for other viral diseases: Secondary | ICD-10-CM

## 2021-12-19 DIAGNOSIS — N401 Enlarged prostate with lower urinary tract symptoms: Secondary | ICD-10-CM

## 2021-12-20 ENCOUNTER — Other Ambulatory Visit (INDEPENDENT_AMBULATORY_CARE_PROVIDER_SITE_OTHER): Payer: 59

## 2021-12-20 DIAGNOSIS — Z1159 Encounter for screening for other viral diseases: Secondary | ICD-10-CM

## 2021-12-20 DIAGNOSIS — N401 Enlarged prostate with lower urinary tract symptoms: Secondary | ICD-10-CM | POA: Diagnosis not present

## 2021-12-20 DIAGNOSIS — R3912 Poor urinary stream: Secondary | ICD-10-CM | POA: Diagnosis not present

## 2021-12-20 DIAGNOSIS — I10 Essential (primary) hypertension: Secondary | ICD-10-CM | POA: Diagnosis not present

## 2021-12-20 LAB — COMPREHENSIVE METABOLIC PANEL
ALT: 15 U/L (ref 0–53)
AST: 17 U/L (ref 0–37)
Albumin: 4.2 g/dL (ref 3.5–5.2)
Alkaline Phosphatase: 57 U/L (ref 39–117)
BUN: 11 mg/dL (ref 6–23)
CO2: 29 mEq/L (ref 19–32)
Calcium: 9 mg/dL (ref 8.4–10.5)
Chloride: 104 mEq/L (ref 96–112)
Creatinine, Ser: 1.2 mg/dL (ref 0.40–1.50)
GFR: 64.98 mL/min (ref >60.00)
Glucose, Bld: 102 mg/dL — ABNORMAL HIGH (ref 70–99)
Potassium: 4.3 mEq/L (ref 3.5–5.1)
Sodium: 138 mEq/L (ref 135–145)
Total Bilirubin: 0.6 mg/dL (ref 0.2–1.2)
Total Protein: 6.6 g/dL (ref 6.0–8.3)

## 2021-12-20 LAB — LIPID PANEL
Cholesterol: 150 mg/dL (ref 0–200)
HDL: 43.9 mg/dL (ref >39.00)
LDL Cholesterol: 97 mg/dL (ref 0–99)
NonHDL: 105.8
Total CHOL/HDL Ratio: 3
Triglycerides: 46 mg/dL (ref 0.0–149.0)
VLDL: 9.2 mg/dL (ref 0.0–40.0)

## 2021-12-22 LAB — HEPATITIS C ANTIBODY: Hepatitis C Ab: NONREACTIVE

## 2021-12-22 LAB — PSA, TOTAL AND FREE
PSA, % Free: 20 % (calc) — ABNORMAL LOW (ref >25)
PSA, Free: 0.8 ng/mL
PSA, Total: 4 ng/mL (ref <=4.0)

## 2021-12-27 ENCOUNTER — Encounter: Payer: Self-pay | Admitting: Family Medicine

## 2021-12-27 ENCOUNTER — Ambulatory Visit (INDEPENDENT_AMBULATORY_CARE_PROVIDER_SITE_OTHER): Payer: 59 | Admitting: Family Medicine

## 2021-12-27 VITALS — BP 136/70 | HR 77 | Temp 97.8°F | Ht 69.0 in | Wt 208.5 lb

## 2021-12-27 DIAGNOSIS — R251 Tremor, unspecified: Secondary | ICD-10-CM | POA: Diagnosis not present

## 2021-12-27 DIAGNOSIS — I10 Essential (primary) hypertension: Secondary | ICD-10-CM

## 2021-12-27 DIAGNOSIS — N401 Enlarged prostate with lower urinary tract symptoms: Secondary | ICD-10-CM | POA: Diagnosis not present

## 2021-12-27 DIAGNOSIS — Z0000 Encounter for general adult medical examination without abnormal findings: Secondary | ICD-10-CM | POA: Diagnosis not present

## 2021-12-27 DIAGNOSIS — R3912 Poor urinary stream: Secondary | ICD-10-CM

## 2021-12-27 MED ORDER — LOSARTAN POTASSIUM 50 MG PO TABS
50.0000 mg | ORAL_TABLET | Freq: Every day | ORAL | 3 refills | Status: DC
Start: 2021-12-27 — End: 2023-01-02

## 2021-12-27 MED ORDER — PROPRANOLOL HCL 40 MG PO TABS: 20.0000 mg | ORAL_TABLET | Freq: Two times a day (BID) | ORAL | 3 refills | Status: DC

## 2021-12-27 MED ORDER — TAMSULOSIN HCL 0.4 MG PO CAPS: 0.4000 mg | ORAL_CAPSULE | Freq: Every day | ORAL | 11 refills | Status: DC

## 2021-12-27 NOTE — Assessment & Plan Note (Signed)
Likely ET. Chronic, stable on current regimen - continue.

## 2021-12-27 NOTE — Patient Instructions (Addendum)
We will request records of colonoscopy from Dr Elnoria Howard in [place] ~2016.  Try tamsulosin in place of terazosin for prostate symptoms.  Monitor blood pressures and update [place] with effect.  Return in 6 months for lab visit only.  You are doing well today Return as needed or in 1 year for next physical.   Health Maintenance, Male Adopting a healthy lifestyle and getting preventive care are important in promoting health and wellness. Ask your health care provider about: The right schedule for you to have regular tests and exams. Things you can do on your own to prevent diseases and keep yourself healthy. What should I know about diet, weight, and exercise? Eat a healthy diet  Eat a diet that includes plenty of vegetables, fruits, low-fat dairy products, and lean protein. Do not eat a lot of foods that are high in solid fats, added sugars, or sodium. Maintain a healthy weight Body mass index (BMI) is a measurement that can be used to identify possible weight problems. It estimates body fat based on height and weight. Your health care provider can help determine your BMI and help you achieve or maintain a healthy weight. Get regular exercise Get regular exercise. This is one of the most important things you can do for your health. Most adults should: Exercise for at least 150 minutes each week. The exercise should increase your heart rate and make you sweat (moderate-intensity exercise). Do strengthening exercises at least twice a week. This is in addition to the moderate-intensity exercise. Spend less time sitting. Even light physical activity can be beneficial. Watch cholesterol and blood lipids Have your blood tested for lipids and cholesterol at 62 years of age, then have this test every 5 years. You may need to have your cholesterol levels checked more often if: Your lipid or cholesterol levels are high. You are older than 62 years of age. You are at high risk for heart disease. What  should I know about cancer screening? Many types of cancers can be detected early and may often be prevented. Depending on your health history and family history, you may need to have cancer screening at various ages. This may include screening for: Colorectal cancer. Prostate cancer. Skin cancer. Lung cancer. What should I know about heart disease, diabetes, and high blood pressure? Blood pressure and heart disease High blood pressure causes heart disease and increases the risk of stroke. This is more likely to develop in people who have high blood pressure readings or are overweight. Talk with your health care provider about your target blood pressure readings. Have your blood pressure checked: Every 3-5 years if you are 3-49 years of age. Every year if you are 14 years old or older. If you are between the ages of 46 and 20 and are a current or former smoker, ask your health care provider if you should have a one-time screening for abdominal aortic aneurysm (AAA). Diabetes Have regular diabetes screenings. This checks your fasting blood sugar level. Have the screening done: Once every three years after age 64 if you are at a normal weight and have a low risk for diabetes. More often and at a younger age if you are overweight or have a high risk for diabetes. What should I know about preventing infection? Hepatitis B If you have a higher risk for hepatitis B, you should be screened for this virus. Talk with your health care provider to find out if you are at risk for hepatitis B infection. Hepatitis C  Blood testing is recommended for: Everyone born from [birth year] through 1966. Anyone with known risk factors for hepatitis C. Sexually transmitted infections (STIs) You should be screened each year for STIs, including gonorrhea and chlamydia, if: You are sexually active and are younger than 62 years of age. You are older than 62 years of age and your health care provider tells you that you are  at risk for this type of infection. Your sexual activity has changed since you were last screened, and you are at increased risk for chlamydia or gonorrhea. Ask your health care provider if you are at risk. Ask your health care provider about whether you are at high risk for HIV. Your health care provider may recommend a prescription medicine to help prevent HIV infection. If you choose to take medicine to prevent HIV, you should first get tested for HIV. You should then be tested every 3 months for as long as you are taking the medicine. Follow these instructions at home: Alcohol use Do not drink alcohol if your health care provider tells you not to drink. If you drink alcohol: Limit how much you have to 0-2 drinks a day. Know how much alcohol is in your drink. In the [place] one drink equals one 12 oz bottle of beer (355 mL), one 5 oz glass of wine (148 mL), or one 1 oz glass of hard liquor (44 mL). Lifestyle Do not use any products that contain nicotine or tobacco. These products include cigarettes, chewing tobacco, and vaping devices, such as e-cigarettes. If you need help quitting, ask your health care provider. Do not use street drugs. Do not share needles. Ask your health care provider for help if you need support or information about quitting drugs. General instructions Schedule regular health, dental, and eye exams. Stay current with your vaccines. Tell your health care provider if: You often feel depressed. You have ever been abused or do not feel safe at home. Summary Adopting a healthy lifestyle and getting preventive care are important in promoting health and wellness. Follow your health care provider's instructions about healthy diet, exercising, and getting tested or screened for diseases. Follow your health care provider's instructions on monitoring your cholesterol and blood pressure. This information is not intended to replace advice given to you by your health care provider.  Make sure you discuss any questions you have with your health care provider. Document Revised: 09/26/2020 Document Reviewed: 09/26/2020 Elsevier Patient Education  [place].

## 2021-12-27 NOTE — Assessment & Plan Note (Signed)
PSA borderline high, but stable over 10 yrs consistent with BPH.  Will return in 6 months for PSA check, and 1 year for CPE.  Given ongoing nocturia symptoms, will trial tamsulosin in place of terazosin, update with effect on BP and BPH symptoms.

## 2021-12-27 NOTE — Progress Notes (Signed)
Patient ID: Joel Morales, male    DOB: 18-Jul-1960, 61 y.o.   MRN: [id number]  This visit was conducted in person.  BP 136/70   Pulse 77   Temp 97.8 F (36.6 C) (Temporal)   Ht 5\' 9"  (1.753 m)   Wt 208 lb 8 oz (94.6 kg)   SpO2 97%   BMI 30.79 kg/m    CC: CPE Subjective:   HPI: Joel Morales is a 61 y.o. male presenting on 12/27/2021 for Annual Exam   HTN - BP well controlled on losartan and propranolol and hytrin.  ET - continues propranolol for this with benefit.  Preventative: Colonoscopy - benign polyps, rpt 5 yrs 02/26/2022) ~2016. Will again request today.  Prostate cancer screening - see below. H/o BPH on terazosin. Nocturia x1-3, strong stream. No fmhx prostate cancer.  Lung cancer screening - not eligible  Flu shot - yearly COVID - J&J 08/2019, Pfizer 03/2020, bivalent 01/2021 Tdap 2016 Pneumonia shot - not due Shingrix - to get at work/pharmacy  Advanced directive discussion -  Seat belt use discussed Sunscreen use discussed. No changing moles on skin. Sees derm ~yearly.  Non smoker Alcohol - 2-4 drinks/wk Dentist - q6 mo  Eye exam - yearly  Lives with wife  [facility]  Activity - walking a few times a week 20-30 min Diet - good fruits/vegetables, good water      Relevant past medical, surgical, family and social history reviewed and updated as indicated. Interim medical history since our last visit reviewed. Allergies and medications reviewed and updated. Outpatient Medications Prior to Visit  Medication Sig Dispense Refill   diphenhydrAMINE (BENADRYL) 25 MG tablet Take 25 mg every 6 (six) hours as needed by mouth.     fluticasone (FLONASE) 50 MCG/ACT nasal spray Place daily into both nostrils.     levocetirizine (XYZAL) 5 MG tablet Take 5 mg by mouth every evening.     Melatonin 10 MG TABS Take by mouth.     Turmeric 500 MG CAPS Take by mouth daily. Only takes in the morning     losartan (COZAAR) 50 MG tablet Take 1 tablet (50 mg total)  by mouth daily. 90 tablet 1   propranolol (INDERAL) 40 MG tablet Take 0.5 tablets (20 mg total) by mouth 2 (two) times daily. 90 tablet 1   terazosin (HYTRIN) 5 MG capsule TAKE 1 CAPSULE BY MOUTH EVERYDAY AT BEDTIME 90 capsule 0   cholecalciferol (VITAMIN D3) 25 MCG (1000 UNIT) tablet Take 1,000 Units by mouth daily.     No facility-administered medications prior to visit.     Per HPI unless specifically indicated in ROS section below Review of Systems  Constitutional:  Negative for activity change, appetite change, chills, fatigue, fever and unexpected weight change.  HENT:  Negative for hearing loss.   Eyes:  Negative for visual disturbance.  Respiratory:  Negative for cough, chest tightness, shortness of breath and wheezing.   Cardiovascular:  Negative for chest pain, palpitations and leg swelling.  Gastrointestinal:  Negative for abdominal distention, abdominal pain, blood in stool, constipation, diarrhea, nausea and vomiting.  Genitourinary:  Negative for difficulty urinating and hematuria.  Musculoskeletal:  Negative for arthralgias, myalgias and neck pain.  Skin:  Negative for rash.  Neurological:  Negative for dizziness, seizures, syncope and headaches.  Hematological:  Negative for adenopathy. Does not bruise/bleed easily.  Psychiatric/Behavioral:  Negative for dysphoric mood. The patient is not nervous/anxious.     Objective:  BP  136/70   Pulse 77   Temp 97.8 F (36.6 C) (Temporal)   Ht 5\' 9"  (1.753 m)   Wt 208 lb 8 oz (94.6 kg)   SpO2 97%   BMI 30.79 kg/m   Wt Readings from Last 3 Encounters:  12/27/21 208 lb 8 oz (94.6 kg)  07/31/21 219 lb (99.3 kg)  12/06/20 213 lb 8 oz (96.8 kg)      Physical Exam Vitals and nursing note reviewed.  Constitutional:      General: He is not in acute distress.    Appearance: Normal appearance. He is well-developed. He is not ill-appearing.  HENT:     Head: Normocephalic and atraumatic.     Right Ear: Hearing, tympanic  membrane, ear canal and external ear normal.     Left Ear: Hearing, tympanic membrane, ear canal and external ear normal.  Eyes:     General: No scleral icterus.    Extraocular Movements: Extraocular movements intact.     Conjunctiva/sclera: Conjunctivae normal.     Pupils: Pupils are equal, round, and reactive to light.  Neck:     Thyroid: No thyroid mass or thyromegaly.  Cardiovascular:     Rate and Rhythm: Normal rate and regular rhythm.     Pulses: Normal pulses.          Radial pulses are 2+ on the right side and 2+ on the left side.     Heart sounds: Normal heart sounds. No murmur heard. Pulmonary:     Effort: Pulmonary effort is normal. No respiratory distress.     Breath sounds: Normal breath sounds. No wheezing, rhonchi or rales.  Abdominal:     General: Bowel sounds are normal. There is no distension.     Palpations: Abdomen is soft. There is no mass.     Tenderness: There is no abdominal tenderness. There is no guarding or rebound.     Hernia: No hernia is present.  Genitourinary:    Prostate: Enlarged (30gm). Not tender and no nodules present.     Rectum: Normal. No mass, tenderness, anal fissure, external hemorrhoid or internal hemorrhoid. Normal anal tone.     Comments: Prostate symmetrically enlarged without induration, firmness, tenderness or nodules Musculoskeletal:        General: Normal range of motion.     Cervical back: Normal range of motion and neck supple.     Right lower leg: No edema.     Left lower leg: No edema.  Lymphadenopathy:     Cervical: No cervical adenopathy.  Skin:    General: Skin is warm and dry.     Findings: No rash.  Neurological:     General: No focal deficit present.     Mental Status: He is alert and oriented to person, place, and time.  Psychiatric:        Mood and Affect: Mood normal.        Behavior: Behavior normal.        Thought Content: Thought content normal.        Judgment: Judgment normal.       Results for orders  placed or performed in visit on 12/20/21  PSA, total and free  Result Value Ref Range   PSA, Total 4.0 < OR = 4.0 ng/mL   PSA, Free 0.8 ng/mL   PSA, % Free 20 (L) >25 % (calc)  Hepatitis C antibody  Result Value Ref Range   Hepatitis C Ab NON-REACTIVE NON-REACTIVE  Comprehensive metabolic panel  Result  Value Ref Range   Sodium 138 135 - 145 mEq/L   Potassium 4.3 3.5 - 5.1 mEq/L   Chloride 104 96 - 112 mEq/L   CO2 29 19 - 32 mEq/L   Glucose, Bld 102 (H) 70 - 99 mg/dL   BUN 11 6 - 23 mg/dL   Creatinine, Ser 2.63 0.40 - 1.50 mg/dL   Total Bilirubin 0.6 0.2 - 1.2 mg/dL   Alkaline Phosphatase 57 39 - 117 U/L   AST 17 0 - 37 U/L   ALT 15 0 - 53 U/L   Total Protein 6.6 6.0 - 8.3 g/dL   Albumin 4.2 3.5 - 5.2 g/dL   GFR 33.54 >56.25 mL/min   Calcium 9.0 8.4 - 10.5 mg/dL  Lipid panel  Result Value Ref Range   Cholesterol 150 0 - 200 mg/dL   Triglycerides 63.8 0.0 - 149.0 mg/dL   HDL 93.73 >42.87 mg/dL   VLDL 9.2 0.0 - 68.1 mg/dL   LDL Cholesterol 97 0 - 99 mg/dL   Total CHOL/HDL Ratio 3    NonHDL 105.80     Assessment & Plan:   Problem List Items Addressed This Visit     Health maintenance examination - Primary (Chronic)    Preventative protocols reviewed and updated unless pt declined. Discussed healthy diet and lifestyle.  Colonoscopy records requested.       Benign prostatic hyperplasia with weak urinary stream    PSA borderline high, but stable over 10 yrs consistent with BPH.  Will return in 6 months for PSA check, and 1 year for CPE.  Given ongoing nocturia symptoms, will trial tamsulosin in place of terazosin, update with effect on BP and BPH symptoms.       Relevant Medications   tamsulosin (FLOMAX) 0.4 MG CAPS capsule   Essential hypertension    Chronic, stable on current regimen - continue.       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        Relevant Medications   propranolol (INDERAL) 40 MG tablet     Meds ordered this encounter  Medications   losartan (COZAAR) 50 MG tablet    Sig: Take 1 tablet (50 mg total) by mouth daily.    Dispense:  90 tablet    Refill:  3   propranolol (INDERAL) 40 MG tablet    Sig: Take 0.5 tablets (20 mg total) by mouth 2 (two) times daily.    Dispense:  90 tablet    Refill:  3   tamsulosin (FLOMAX) 0.4 MG CAPS capsule    Sig: Take 1 capsule (0.4 mg total) by mouth daily.    Dispense:  30 capsule    Refill:  11   No orders of the defined types were placed in this encounter.   Patient instructions: We will request records of colonoscopy from Dr Elnoria Howard in [place] ~2016.  Try tamsulosin in place of terazosin for prostate symptoms.  Monitor blood pressures and update [place] with effect.  Return in 6 months for lab visit only.  You are doing well today Return as needed or in 1 year for next physical.   Follow up plan: Return in about 1 year (around 12/28/2022) for annual exam, prior fasting for blood work.  Eustaquio Boyden, MD

## 2021-12-27 NOTE — Assessment & Plan Note (Addendum)
Preventative protocols reviewed and updated unless pt declined. Discussed healthy diet and lifestyle.  Colonoscopy records requested.

## 2021-12-27 NOTE — Assessment & Plan Note (Signed)
Chronic, stable on current regimen - continue. 

## 2022-01-02 ENCOUNTER — Encounter: Payer: Self-pay | Admitting: Family Medicine

## 2022-01-02 ENCOUNTER — Telehealth: Payer: Self-pay | Admitting: Family Medicine

## 2022-01-02 DIAGNOSIS — Z1211 Encounter for screening for malignant neoplasm of colon: Secondary | ICD-10-CM

## 2022-01-02 NOTE — Telephone Encounter (Signed)
Lvm asking pt to call back. Need to relay Dr. G's message and get answer to his question.  

## 2022-01-02 NOTE — Telephone Encounter (Signed)
Please notify received records of colonoscopy from Dr. Elnoria Howard - he had it  done 09/2011, had 1 polyp, rec at that time was repeat in 5 yrs. He is due for colonoscopy and I will place referral - would he like to return to Dr Elnoria Howard or go to [facility]?

## 2022-01-03 NOTE — Telephone Encounter (Signed)
Lvm asking pt to call back. Need to relay Dr. G's message and get answer to his question.  

## 2022-01-04 NOTE — Telephone Encounter (Signed)
Patient called back in returning a call he received. Informed of Dr. Reece Agar message. Patient stated that he would go to either Dr. Elnoria Howard or he could go to [facility], it doesn't matter to him. Thank you!

## 2022-01-04 NOTE — Addendum Note (Signed)
Addended by: Eustaquio Boyden on: 01/04/2022 12:57 PM   Modules accepted: Orders

## 2022-01-04 NOTE — Telephone Encounter (Signed)
Referral placed back to Dr Elnoria Howard. Anticipate may be quicker than with [facility] at this time.

## 2022-03-08 ENCOUNTER — Other Ambulatory Visit: Payer: Self-pay | Admitting: Family Medicine

## 2022-03-08 DIAGNOSIS — N401 Enlarged prostate with lower urinary tract symptoms: Secondary | ICD-10-CM

## 2022-03-08 DIAGNOSIS — I10 Essential (primary) hypertension: Secondary | ICD-10-CM

## 2022-03-08 NOTE — Telephone Encounter (Signed)
Change in therapy.  Tamsulosin rx sent [in place of terazosin] on 12/27/21, #30/11 to [facility].  (See 12/27/21 OV notes.)

## 2022-03-26 ENCOUNTER — Other Ambulatory Visit: Payer: Self-pay | Admitting: Family Medicine

## 2022-03-26 DIAGNOSIS — N401 Enlarged prostate with lower urinary tract symptoms: Secondary | ICD-10-CM

## 2022-03-27 NOTE — Telephone Encounter (Signed)
Message from pharmacy:      Alternative Requested:PATIENT REQUSTING CONSIDERATION OF A CHANGE TO SILODOSIN FOR INSURANCE SAVINGS.      Tamsulosin Last filled:  12/27/21, #30 Last OV:  12/27/21, CPE Next OV:  none

## 2022-03-28 NOTE — Telephone Encounter (Signed)
Ok to try silodosin (Rapaflo) if cheaper alternative.

## 2022-03-28 NOTE — Telephone Encounter (Signed)
Lvm asking pt to call back.  Need to notify pt alternative rx was sent to replace tamsulosin due to being cheaper.  

## 2022-03-29 NOTE — Telephone Encounter (Signed)
Lvm asking pt to call back.  Need to notify pt alternative rx was sent to replace tamsulosin due to being cheaper.

## 2022-03-30 NOTE — Telephone Encounter (Signed)
Lvm asking pt to call back.  Need to notify pt alternative rx was sent to replace tamsulosin due to being cheaper.  Also sending message in pt's MyChart.

## 2022-06-03 ENCOUNTER — Encounter: Payer: Self-pay | Admitting: Family Medicine

## 2022-06-04 NOTE — Telephone Encounter (Signed)
Printed Physician Results Form and completed all required info.   Placed form in Dr. Synthia Innocent box.

## 2022-06-04 NOTE — Telephone Encounter (Addendum)
Filled and in Lisa's box.  He will need waist circumference documented as well.

## 2022-06-05 NOTE — Telephone Encounter (Addendum)
According to form, pt does not need waist circumference documented. I will let him know form is ready for him to sign.   [Placed form at front office.]

## 2022-07-16 DIAGNOSIS — I10 Essential (primary) hypertension: Secondary | ICD-10-CM | POA: Diagnosis not present

## 2022-07-16 DIAGNOSIS — Z1211 Encounter for screening for malignant neoplasm of colon: Secondary | ICD-10-CM | POA: Diagnosis not present

## 2022-08-20 HISTORY — PX: COLONOSCOPY: SHX174

## 2022-08-30 DIAGNOSIS — D124 Benign neoplasm of descending colon: Secondary | ICD-10-CM | POA: Diagnosis not present

## 2022-08-30 DIAGNOSIS — Z1211 Encounter for screening for malignant neoplasm of colon: Secondary | ICD-10-CM | POA: Diagnosis not present

## 2022-08-30 DIAGNOSIS — D123 Benign neoplasm of transverse colon: Secondary | ICD-10-CM | POA: Diagnosis not present

## 2022-08-30 DIAGNOSIS — K635 Polyp of colon: Secondary | ICD-10-CM | POA: Diagnosis not present

## 2022-08-30 DIAGNOSIS — D120 Benign neoplasm of cecum: Secondary | ICD-10-CM | POA: Diagnosis not present

## 2022-08-30 DIAGNOSIS — D125 Benign neoplasm of sigmoid colon: Secondary | ICD-10-CM | POA: Diagnosis not present

## 2022-08-30 DIAGNOSIS — K5730 Diverticulosis of large intestine without perforation or abscess without bleeding: Secondary | ICD-10-CM | POA: Diagnosis not present

## 2022-08-30 LAB — HM COLONOSCOPY

## 2022-09-28 ENCOUNTER — Other Ambulatory Visit: Payer: Self-pay | Admitting: Family Medicine

## 2022-09-28 DIAGNOSIS — N401 Enlarged prostate with lower urinary tract symptoms: Secondary | ICD-10-CM

## 2022-09-28 NOTE — Telephone Encounter (Signed)
E-scribed refill.  Plz schedule CPE and fasting lab visits to prevent delays in future refills.  

## 2022-09-28 NOTE — Telephone Encounter (Signed)
Lvm for patient tcb and schedule 

## 2022-10-02 DIAGNOSIS — H5203 Hypermetropia, bilateral: Secondary | ICD-10-CM | POA: Diagnosis not present

## 2022-10-10 DIAGNOSIS — Z0100 Encounter for examination of eyes and vision without abnormal findings: Secondary | ICD-10-CM | POA: Diagnosis not present

## 2022-11-21 ENCOUNTER — Ambulatory Visit: Payer: No Typology Code available for payment source | Admitting: Family Medicine

## 2022-12-19 ENCOUNTER — Encounter (INDEPENDENT_AMBULATORY_CARE_PROVIDER_SITE_OTHER): Payer: Self-pay

## 2022-12-22 ENCOUNTER — Other Ambulatory Visit: Payer: Self-pay | Admitting: Family Medicine

## 2022-12-22 DIAGNOSIS — I10 Essential (primary) hypertension: Secondary | ICD-10-CM

## 2022-12-22 DIAGNOSIS — N401 Enlarged prostate with lower urinary tract symptoms: Secondary | ICD-10-CM

## 2022-12-24 ENCOUNTER — Other Ambulatory Visit (INDEPENDENT_AMBULATORY_CARE_PROVIDER_SITE_OTHER): Payer: No Typology Code available for payment source

## 2022-12-24 DIAGNOSIS — N401 Enlarged prostate with lower urinary tract symptoms: Secondary | ICD-10-CM | POA: Diagnosis not present

## 2022-12-24 DIAGNOSIS — I10 Essential (primary) hypertension: Secondary | ICD-10-CM | POA: Diagnosis not present

## 2022-12-24 DIAGNOSIS — R3912 Poor urinary stream: Secondary | ICD-10-CM

## 2022-12-24 LAB — LIPID PANEL
Cholesterol: 161 mg/dL (ref 0–200)
HDL: 49.7 mg/dL (ref >39.00)
LDL Cholesterol: 101 mg/dL — ABNORMAL HIGH (ref 0–99)
NonHDL: 111.51
Total CHOL/HDL Ratio: 3
Triglycerides: 52 mg/dL (ref 0.0–149.0)
VLDL: 10.4 mg/dL (ref 0.0–40.0)

## 2022-12-24 LAB — BASIC METABOLIC PANEL
BUN: 12 mg/dL (ref 6–23)
CO2: 27 mEq/L (ref 19–32)
Calcium: 9.1 mg/dL (ref 8.4–10.5)
Chloride: 104 mEq/L (ref 96–112)
Creatinine, Ser: 1.11 mg/dL (ref 0.40–1.50)
GFR: 70.85 mL/min (ref >60.00)
Glucose, Bld: 99 mg/dL (ref 70–99)
Potassium: 4.1 mEq/L (ref 3.5–5.1)
Sodium: 139 mEq/L (ref 135–145)

## 2023-01-02 ENCOUNTER — Encounter: Payer: Self-pay | Admitting: Family Medicine

## 2023-01-02 ENCOUNTER — Ambulatory Visit (INDEPENDENT_AMBULATORY_CARE_PROVIDER_SITE_OTHER): Payer: No Typology Code available for payment source | Admitting: Family Medicine

## 2023-01-02 VITALS — BP 136/80 | HR 78 | Temp 97.4°F | Ht 69.0 in | Wt 208.5 lb

## 2023-01-02 DIAGNOSIS — R251 Tremor, unspecified: Secondary | ICD-10-CM | POA: Diagnosis not present

## 2023-01-02 DIAGNOSIS — N401 Enlarged prostate with lower urinary tract symptoms: Secondary | ICD-10-CM | POA: Diagnosis not present

## 2023-01-02 DIAGNOSIS — I10 Essential (primary) hypertension: Secondary | ICD-10-CM | POA: Diagnosis not present

## 2023-01-02 DIAGNOSIS — Z0000 Encounter for general adult medical examination without abnormal findings: Secondary | ICD-10-CM

## 2023-01-02 DIAGNOSIS — Z8249 Family history of ischemic heart disease and other diseases of the circulatory system: Secondary | ICD-10-CM

## 2023-01-02 DIAGNOSIS — R3912 Poor urinary stream: Secondary | ICD-10-CM

## 2023-01-02 DIAGNOSIS — R972 Elevated prostate specific antigen [PSA]: Secondary | ICD-10-CM

## 2023-01-02 MED ORDER — TAMSULOSIN HCL 0.4 MG PO CAPS: 0.4000 mg | ORAL_CAPSULE | Freq: Every day | ORAL | 4 refills | Status: DC

## 2023-01-02 MED ORDER — PROPRANOLOL HCL 40 MG PO TABS
20.0000 mg | ORAL_TABLET | Freq: Two times a day (BID) | ORAL | 4 refills | Status: DC
Start: 2023-01-02 — End: 2024-01-03

## 2023-01-02 MED ORDER — LOSARTAN POTASSIUM 50 MG PO TABS
50.0000 mg | ORAL_TABLET | Freq: Every day | ORAL | 4 refills | Status: DC
Start: 2023-01-02 — End: 2024-02-26

## 2023-01-02 NOTE — Assessment & Plan Note (Signed)
Chronic, deteriorated - will restart tamsulosin due to formulary change.  DRE with enlarged prostate without significant asymmetry tenderness or prostate nodules See below re PSA elevation.

## 2023-01-02 NOTE — Progress Notes (Signed)
Ph: [phone] Fax: [phone]   Patient ID: Joel Morales, male    DOB: [birth year], 62 y.o.   MRN: [id number]  This visit was conducted in person.  BP 136/80   Pulse 78   Temp (!) 97.4 F (36.3 C) (Temporal)   Ht 5\' 9"  (1.753 m)   Wt 208 lb 8 oz (94.6 kg)   SpO2 98%   BMI 30.79 kg/m    CC: CPE Subjective:   HPI: Joel Morales is a 62 y.o. male presenting on 01/02/2023 for Annual Exam   Work stressors.   HTN - BP well controlled on losartan and propranolol and hytrin.  Essential tremor - continues propranolol for this with benefit.  BPH - formulary change - silodosin has doubled in price. Would like to try tamsulosin.   Preventative: Colonoscopy 09/2011 - TA, diverticulosis, rpt 5 yrs Joel Morales)  Colonoscopy 09/2022 - rpt 5 yrs Joel Morales) - records requested Prostate cancer screening - see below. H/o BPH on terazosin. Nocturia x1-3, strong stream. No fmhx prostate cancer. Fmhx BPH.  Lung cancer screening - not eligible  Flu shot - yearly COVID - J&J 08/2019, Pfizer 03/2020, bivalent 01/2021 Tdap 2016 Pneumonia shot - not due Shingrix - 11/2022, rpt pending Advanced directive discussion - does have this at home - wife Joel Morales is HCPOA.  Seat belt use discussed Sunscreen use discussed. No changing moles on skin.  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 Interim medical history since our last visit reviewed. Allergies and medications reviewed and updated. Outpatient Medications Prior to Visit  Medication Sig Dispense Refill   diphenhydrAMINE (BENADRYL) 25 MG tablet Take 25 mg every 6 (six) hours as needed by mouth.     fluticasone (FLONASE) 50 MCG/ACT nasal spray  Place daily into both nostrils.     levocetirizine (XYZAL) 5 MG tablet Take 5 mg by mouth every evening.     Melatonin 10 MG TABS Take by mouth.     losartan (COZAAR) 50 MG tablet Take 1 tablet (50 mg total) by mouth daily. 90 tablet 3   propranolol (INDERAL) 40 MG tablet Take 0.5 tablets (20 mg total) by mouth 2 (two) times daily. 90 tablet 3   silodosin (RAPAFLO) 4 MG CAPS capsule TAKE 1 CAPSULE (4 MG TOTAL) BY MOUTH DAILY WITH BREAKFAST. 90 capsule 0   Turmeric 500 MG CAPS Take by mouth daily. Only takes in the morning     No facility-administered medications prior to visit.     Per HPI unless specifically indicated in ROS section below Review of Systems  Objective:  BP 136/80   Pulse 78   Temp (!) 97.4 F (36.3 C) (Temporal)   Ht 5\' 9"  (1.753 m)   Wt 208 lb 8 oz (94.6 kg)   SpO2 98%   BMI 30.79 kg/m   Wt Readings from Last 3 Encounters:  01/02/23 208 lb 8 oz (94.6 kg)  12/27/21 208 lb 8 oz (94.6 kg)  07/31/21 219 lb (99.3 kg)      Physical Exam Vitals and nursing note reviewed.  Constitutional:      General: He is not in acute distress.  Appearance: Normal appearance. He is well-developed. He is not ill-appearing.  HENT:     Head: Normocephalic and atraumatic.     Right Ear: Hearing, tympanic membrane, ear canal and external ear normal.     Left Ear: Hearing, tympanic membrane, ear canal and external ear normal.     Nose: Nose normal.     Mouth/Throat:     Mouth: Mucous membranes are moist.     Pharynx: Oropharynx is clear. No oropharyngeal exudate or posterior oropharyngeal erythema.  Eyes:     General: No scleral icterus.    Extraocular Movements: Extraocular movements intact.     Conjunctiva/sclera: Conjunctivae normal.     Pupils: Pupils are equal, round, and reactive to light.  Neck:     Thyroid: No thyroid mass or thyromegaly.  Cardiovascular:     Rate and Rhythm: Normal rate and regular rhythm.     Pulses: Normal pulses.          Radial pulses are 2+  on the right side and 2+ on the left side.     Heart sounds: Normal heart sounds. No murmur heard. Pulmonary:     Effort: Pulmonary effort is normal. No respiratory distress.     Breath sounds: Normal breath sounds. No wheezing, rhonchi or rales.  Abdominal:     General: Bowel sounds are normal. There is no distension.     Palpations: Abdomen is soft. There is no mass.     Tenderness: There is no abdominal tenderness. There is no guarding or rebound.     Hernia: No hernia is present.  Genitourinary:    Prostate: Enlarged (significant). Not tender and no nodules present.     Rectum: External hemorrhoid (noninflamed) present. No mass, tenderness, anal fissure or internal hemorrhoid. Normal anal tone.  Musculoskeletal:        General: Normal range of motion.     Cervical back: Normal range of motion and neck supple.     Right lower leg: No edema.     Left lower leg: No edema.  Lymphadenopathy:     Cervical: No cervical adenopathy.  Skin:    General: Skin is warm and dry.     Findings: No rash.  Neurological:     General: No focal deficit present.     Mental Status: He is alert and oriented to person, place, and time.  Psychiatric:        Mood and Affect: Mood normal.        Behavior: Behavior normal.        Thought Content: Thought content normal.        Judgment: Judgment normal.       Results for orders placed or performed in visit on 12/24/22  PSA, Total with Reflex to PSA, Free  Result Value Ref Range   PSA, Total 6.0 (H) < OR = 4.0 ng/mL  Basic metabolic panel  Result Value Ref Range   Sodium 139 135 - 145 mEq/L   Potassium 4.1 3.5 - 5.1 mEq/L   Chloride 104 96 - 112 mEq/L   CO2 27 19 - 32 mEq/L   Glucose, Bld 99 70 - 99 mg/dL   BUN 12 6 - 23 mg/dL   Creatinine, Ser 1.61 0.40 - 1.50 mg/dL   GFR 09.60 >45.40 mL/min   Calcium 9.1 8.4 - 10.5 mg/dL  Lipid panel  Result Value Ref Range   Cholesterol 161 0 - 200 mg/dL   Triglycerides 98.1 0.0 - 149.0 mg/dL   HDL  49.70  >39.00 mg/dL   VLDL 16.1 0.0 - 09.6 mg/dL   LDL Cholesterol 045 (H) 0 - 99 mg/dL   Total CHOL/HDL Ratio 3    NonHDL 111.51   reflex PSA, Free  Result Value Ref Range   PSA, Free 1.5 ng/mL   PSA, % Free 25 (L) >25 % (calc)    Assessment & Plan:   Problem List Items Addressed This Visit     Health maintenance examination - Primary (Chronic)    Preventative protocols reviewed and updated unless pt declined. Discussed healthy diet and lifestyle.       Benign prostatic hyperplasia with weak urinary stream    Chronic, deteriorated - will restart tamsulosin due to formulary change.  DRE with enlarged prostate without significant asymmetry tenderness or prostate nodules See below re PSA elevation.       Relevant Medications   tamsulosin (FLOMAX) 0.4 MG CAPS capsule   Other Relevant Orders   PSA, Total with Reflex to PSA, Free   Essential hypertension    Chronic, stable. Continue current regimen.       Relevant Medications   losartan (COZAAR) 50 MG tablet   propranolol (INDERAL) 40 MG tablet   Other Relevant Orders   CT CARDIAC SCORING (SELF PAY ONLY)   Tremor    Thought ET managing with propranolol with benefit.       Relevant Medications   propranolol (INDERAL) 40 MG tablet   Elevated PSA    PSA increased from 4 to 6. Free PSA % reassuring.  No fmhx prostate cancer, but there is significant BPH in family.  RTC 3 mo lab visit only recheck PSA       Relevant Orders   PSA, Total with Reflex to PSA, Free   Family history of coronary artery disease    Discussed coronary CT calcium score - self pay. Agrees to check.  With fmhx will also check Lp(a) next labwork.  The 10-year ASCVD risk score (Arnett DK, et al., 2019) is: 12%   Values used to calculate the score:     Age: 72 years     Sex: Male     Is Non-Hispanic African American: No     Diabetic: No     Tobacco smoker: No     Systolic Blood Pressure: 136 mmHg     Is BP treated: Yes     HDL Cholesterol: 49.7  mg/dL     Total Cholesterol: 161 mg/dL       Relevant Orders   Lipoprotein A (LPA)   CT CARDIAC SCORING (SELF PAY ONLY)     Meds ordered this encounter  Medications   losartan (COZAAR) 50 MG tablet    Sig: Take 1 tablet (50 mg total) by mouth daily.    Dispense:  90 tablet    Refill:  4   propranolol (INDERAL) 40 MG tablet    Sig: Take 0.5 tablets (20 mg total) by mouth 2 (two) times daily.    Dispense:  90 tablet    Refill:  4   tamsulosin (FLOMAX) 0.4 MG CAPS capsule    Sig: Take 1 capsule (0.4 mg total) by mouth daily after supper.    Dispense:  90 capsule    Refill:  4    To replace silodosin    Orders Placed This Encounter  Procedures   CT CARDIAC SCORING (SELF PAY ONLY)    Standing Status:   Future    Standing Expiration Date:   01/02/2024  Order Specific Question:   Preferred imaging location?    Answer:   [facility]   PSA, Total with Reflex to PSA, Free    Standing Status:   Future    Standing Expiration Date:   01/02/2024   Lipoprotein A (LPA)    Standing Status:   Future    Standing Expiration Date:   01/02/2024    Patient Instructions  We will request latest colonoscopy from Dr Joel Morales (09/2022) PSA was elevated thought due to BPH - schedule lab visit in 3 months to repeat PSA.  We will call you to schedule coronary CT calcium score.  Good to see you today Return as needed or in 1 year for next physical.`  Follow up plan: Return in about 1 year (around 01/02/2024), or if symptoms worsen or fail to improve, for annual exam, prior fasting for blood work.  Eustaquio Boyden, MD

## 2023-01-02 NOTE — Assessment & Plan Note (Signed)
Thought ET managing with propranolol with benefit.

## 2023-01-02 NOTE — Assessment & Plan Note (Addendum)
Chronic, stable. Continue current regimen. 

## 2023-01-02 NOTE — Assessment & Plan Note (Signed)
Discussed coronary CT calcium score - self pay. Agrees to check.  With fmhx will also check Lp(a) next labwork.  The 10-year ASCVD risk score (Arnett DK, et al., 2019) is: 12%   Values used to calculate the score:     Age: 62 years     Sex: Male     Is Non-Hispanic African American: No     Diabetic: No     Tobacco smoker: No     Systolic Blood Pressure: 136 mmHg     Is BP treated: Yes     HDL Cholesterol: 49.7 mg/dL     Total Cholesterol: 161 mg/dL

## 2023-01-02 NOTE — Assessment & Plan Note (Signed)
Preventative protocols reviewed and updated unless pt declined. Discussed healthy diet and lifestyle.  

## 2023-01-02 NOTE — Assessment & Plan Note (Signed)
PSA increased from 4 to 6. Free PSA % reassuring.  No fmhx prostate cancer, but there is significant BPH in family.  RTC 3 mo lab visit only recheck PSA

## 2023-01-02 NOTE — Patient Instructions (Addendum)
We will request latest colonoscopy from Dr Elnoria Howard (09/2022) PSA was elevated thought due to BPH - schedule lab visit in 3 months to repeat PSA.  We will call you to schedule coronary CT calcium score.  Good to see you today Return as needed or in 1 year for next physical.`

## 2023-01-03 ENCOUNTER — Encounter: Payer: Self-pay | Admitting: *Deleted

## 2023-01-04 ENCOUNTER — Encounter: Payer: Self-pay | Admitting: Family Medicine

## 2023-05-02 ENCOUNTER — Emergency Department (HOSPITAL_COMMUNITY): Payer: No Typology Code available for payment source

## 2023-05-02 ENCOUNTER — Encounter (HOSPITAL_COMMUNITY): Payer: Self-pay | Admitting: Emergency Medicine

## 2023-05-02 ENCOUNTER — Other Ambulatory Visit: Payer: Self-pay

## 2023-05-02 ENCOUNTER — Observation Stay (HOSPITAL_COMMUNITY)
Admission: EM | Admit: 2023-05-02 | Discharge: 2023-05-03 | Disposition: A | Discharge status: Home / Self Care | Payer: No Typology Code available for payment source | Attending: Surgery | Admitting: Surgery

## 2023-05-02 DIAGNOSIS — K810 Acute cholecystitis: Principal | ICD-10-CM | POA: Diagnosis present

## 2023-05-02 DIAGNOSIS — K8000 Calculus of gallbladder with acute cholecystitis without obstruction: Secondary | ICD-10-CM | POA: Diagnosis not present

## 2023-05-02 DIAGNOSIS — I10 Essential (primary) hypertension: Secondary | ICD-10-CM | POA: Insufficient documentation

## 2023-05-02 DIAGNOSIS — R109 Unspecified abdominal pain: Secondary | ICD-10-CM | POA: Diagnosis present

## 2023-05-02 DIAGNOSIS — Z79899 Other long term (current) drug therapy: Secondary | ICD-10-CM | POA: Insufficient documentation

## 2023-05-02 LAB — BASIC METABOLIC PANEL
Anion gap: 11 (ref 5–15)
BUN: 11 mg/dL (ref 8–23)
CO2: 21 mmol/L — ABNORMAL LOW (ref 22–32)
Calcium: 9.7 mg/dL (ref 8.9–10.3)
Chloride: 109 mmol/L (ref 98–111)
Creatinine, Ser: 1.18 mg/dL (ref 0.61–1.24)
GFR, Estimated: >60 mL/min (ref >60)
Glucose, Bld: 141 mg/dL — ABNORMAL HIGH (ref 70–99)
Potassium: 4.3 mmol/L (ref 3.5–5.1)
Sodium: 141 mmol/L (ref 135–145)

## 2023-05-02 LAB — HEPATIC FUNCTION PANEL
ALT: 25 U/L (ref 0–44)
AST: 26 U/L (ref 15–41)
Albumin: 4.1 g/dL (ref 3.5–5.0)
Alkaline Phosphatase: 54 U/L (ref 38–126)
Bilirubin, Direct: 0.2 mg/dL (ref 0.0–0.2)
Indirect Bilirubin: 1 mg/dL — ABNORMAL HIGH (ref 0.3–0.9)
Total Bilirubin: 1.2 mg/dL — ABNORMAL HIGH (ref <1.2)
Total Protein: 6.8 g/dL (ref 6.5–8.1)

## 2023-05-02 LAB — CBC
HCT: 45.2 % (ref 39.0–52.0)
Hemoglobin: 15.8 g/dL (ref 13.0–17.0)
MCH: 30.9 pg (ref 26.0–34.0)
MCHC: 35 g/dL (ref 30.0–36.0)
MCV: 88.3 fL (ref 80.0–100.0)
Platelets: 259 10*3/uL (ref 150–400)
RBC: 5.12 MIL/uL (ref 4.22–5.81)
RDW: 13.1 % (ref 11.5–15.5)
WBC: 14.5 10*3/uL — ABNORMAL HIGH (ref 4.0–10.5)
nRBC: 0 % (ref 0.0–0.2)

## 2023-05-02 LAB — TROPONIN I (HIGH SENSITIVITY)
Troponin I (High Sensitivity): 3 ng/L (ref <18)
Troponin I (High Sensitivity): 2 ng/L (ref <18)

## 2023-05-02 LAB — HIV ANTIBODY (ROUTINE TESTING W REFLEX): HIV Screen 4th Generation wRfx: NONREACTIVE

## 2023-05-02 LAB — LIPASE, BLOOD: Lipase: 32 U/L (ref 11–51)

## 2023-05-02 MED ORDER — POLYETHYLENE GLYCOL 3350 17 G PO PACK: 17.0000 g | PACK | Freq: Every day | ORAL | Status: DC | PRN

## 2023-05-02 MED ORDER — PROCHLORPERAZINE MALEATE 10 MG PO TABS: 10.0000 mg | ORAL_TABLET | Freq: Four times a day (QID) | ORAL | Status: DC | PRN

## 2023-05-02 MED ORDER — TAMSULOSIN HCL 0.4 MG PO CAPS
0.4000 mg | ORAL_CAPSULE | Freq: Every day | ORAL | Status: DC
  Administered 2023-05-02: 0.4 mg via ORAL
  Filled 2023-05-02: qty 1

## 2023-05-02 MED ORDER — ONDANSETRON HCL 4 MG/2ML IJ SOLN: 4.0000 mg | Freq: Four times a day (QID) | INTRAMUSCULAR | Status: DC | PRN

## 2023-05-02 MED ORDER — MORPHINE SULFATE (PF) 4 MG/ML IV SOLN
4.0000 mg | Freq: Once | INTRAVENOUS | Status: AC
  Administered 2023-05-02: 4 mg via INTRAVENOUS
  Filled 2023-05-02: qty 1

## 2023-05-02 MED ORDER — OXYCODONE HCL 5 MG PO TABS: 5.0000 mg | ORAL_TABLET | ORAL | Status: DC | PRN

## 2023-05-02 MED ORDER — ACETAMINOPHEN 325 MG PO TABS: 650.0000 mg | ORAL_TABLET | Freq: Four times a day (QID) | ORAL | Status: DC | PRN

## 2023-05-02 MED ORDER — HYDROMORPHONE HCL 1 MG/ML IJ SOLN: 0.5000 mg | INTRAMUSCULAR | Status: DC | PRN

## 2023-05-02 MED ORDER — LOSARTAN POTASSIUM 50 MG PO TABS
50.0000 mg | ORAL_TABLET | Freq: Every day | ORAL | Status: DC
  Administered 2023-05-02: 50 mg via ORAL
  Filled 2023-05-02: qty 1

## 2023-05-02 MED ORDER — ONDANSETRON HCL 4 MG/2ML IJ SOLN
4.0000 mg | Freq: Four times a day (QID) | INTRAMUSCULAR | Status: DC | PRN
Start: 2023-05-02 — End: 2023-05-03

## 2023-05-02 MED ORDER — PANTOPRAZOLE SODIUM 40 MG IV SOLR
40.0000 mg | Freq: Every day | INTRAVENOUS | Status: DC
Start: 2023-05-02 — End: 2023-05-03
  Administered 2023-05-02: 40 mg via INTRAVENOUS
  Filled 2023-05-02: qty 10

## 2023-05-02 MED ORDER — HYDROMORPHONE HCL 1 MG/ML IJ SOLN: 1.0000 mg | INTRAMUSCULAR | Status: DC | PRN

## 2023-05-02 MED ORDER — METHOCARBAMOL 1000 MG/10ML IJ SOLN: 500.0000 mg | Freq: Three times a day (TID) | INTRAMUSCULAR | Status: DC | PRN

## 2023-05-02 MED ORDER — ACETAMINOPHEN 650 MG RE SUPP: 650.0000 mg | Freq: Four times a day (QID) | RECTAL | Status: DC | PRN

## 2023-05-02 MED ORDER — DIPHENHYDRAMINE HCL 50 MG/ML IJ SOLN: 12.5000 mg | Freq: Four times a day (QID) | INTRAMUSCULAR | Status: DC | PRN

## 2023-05-02 MED ORDER — LACTATED RINGERS IV BOLUS
1000.0000 mL | Freq: Once | INTRAVENOUS | Status: AC
  Administered 2023-05-02: 1000 mL via INTRAVENOUS

## 2023-05-02 MED ORDER — MELATONIN 5 MG PO TABS: 10.0000 mg | ORAL_TABLET | Freq: Every evening | ORAL | Status: DC | PRN

## 2023-05-02 MED ORDER — PROPRANOLOL HCL 20 MG PO TABS
20.0000 mg | ORAL_TABLET | Freq: Two times a day (BID) | ORAL | Status: DC
  Administered 2023-05-02 (×2): 20 mg via ORAL
  Filled 2023-05-02: qty 1
  Filled 2023-05-02: qty 2

## 2023-05-02 MED ORDER — ONDANSETRON HCL 4 MG/2ML IJ SOLN
4.0000 mg | Freq: Once | INTRAMUSCULAR | Status: AC
  Administered 2023-05-02: 4 mg via INTRAVENOUS
  Filled 2023-05-02: qty 2

## 2023-05-02 MED ORDER — CEFTRIAXONE SODIUM 2 G IJ SOLR
2.0000 g | INTRAMUSCULAR | Status: DC
  Administered 2023-05-02: 2 g via INTRAVENOUS
  Filled 2023-05-02: qty 20

## 2023-05-02 MED ORDER — HYDROMORPHONE HCL 1 MG/ML IJ SOLN
1.0000 mg | Freq: Once | INTRAMUSCULAR | Status: AC
  Administered 2023-05-02: 1 mg via INTRAVENOUS
  Filled 2023-05-02: qty 1

## 2023-05-02 MED ORDER — PROCHLORPERAZINE EDISYLATE 10 MG/2ML IJ SOLN: 5.0000 mg | Freq: Four times a day (QID) | INTRAMUSCULAR | Status: DC | PRN

## 2023-05-02 MED ORDER — HYDRALAZINE HCL 20 MG/ML IJ SOLN: 10.0000 mg | INTRAMUSCULAR | Status: DC | PRN

## 2023-05-02 MED ORDER — SIMETHICONE 80 MG PO CHEW: 40.0000 mg | CHEWABLE_TABLET | Freq: Four times a day (QID) | ORAL | Status: DC | PRN

## 2023-05-02 MED ORDER — ONDANSETRON 4 MG PO TBDP: 4.0000 mg | ORAL_TABLET | Freq: Four times a day (QID) | ORAL | Status: DC | PRN

## 2023-05-02 MED ORDER — CHLORHEXIDINE GLUCONATE CLOTH 2 % EX PADS: 6.0000 | MEDICATED_PAD | Freq: Once | CUTANEOUS | Status: DC

## 2023-05-02 MED ORDER — METHOCARBAMOL 500 MG PO TABS: 500.0000 mg | ORAL_TABLET | Freq: Three times a day (TID) | ORAL | Status: DC | PRN

## 2023-05-02 MED ORDER — DIPHENHYDRAMINE HCL 12.5 MG/5ML PO ELIX: 12.5000 mg | ORAL_SOLUTION | Freq: Four times a day (QID) | ORAL | Status: DC | PRN

## 2023-05-02 MED ORDER — ENOXAPARIN SODIUM 40 MG/0.4ML IJ SOSY
40.0000 mg | PREFILLED_SYRINGE | INTRAMUSCULAR | Status: DC
  Administered 2023-05-02: 40 mg via SUBCUTANEOUS
  Filled 2023-05-02: qty 0.4

## 2023-05-02 NOTE — ED Notes (Signed)
ED TO INPATIENT HANDOFF REPORT  ED Nurse Name and Phone #: Jen Mow [phone]   S Name/Age/Gender Joel Morales 62 y.o. male Room/Bed: H012C/H012C  Code Status   Code Status: Full Code  Home/SNF/Other Home Patient oriented to: self, place, time, and situation Is this baseline? Yes   Triage Complete: Triage complete  Chief Complaint Acute cholecystitis [K81.0]  Triage Note Pt came in POV for Abd pain at 4a.  Vomiting at 5a,  CP followed the vomiting.  5/10 CP and 5/10 ABD pain are present now.  CP is centralized, non-radiating.   Pt has hx of HTN but no cardiac hx.    Allergies No Known Allergies  Level of Care/Admitting Diagnosis ED Disposition     ED Disposition  Admit   Condition  --   Comment  Hospital Area: [facility] [[id number]]  Level of Care: Med-Surg [16]  May place patient in observation at [facility] or [facility] if equivalent level of care is available:: No  Covid Evaluation: Asymptomatic - no recent exposure (last 10 days) testing not required  Diagnosis: Acute cholecystitis [575.0.ICD-9-CM]  Admitting Physician: CCS, MD [phone]  Attending Physician: CCS, MD [[id number]]          B Medical/Surgery History Past Medical History:  Diagnosis Date   Achilles tendinitis, right leg 07/20/2018   Allergy    Anxiety    Arthritis    Hypertension    Left tennis elbow 07/20/2018   Past Surgical History:  Procedure Laterality Date   COLONOSCOPY  09/2011   TA, diverticulosis, rpt 5 yrs Elnoria Howard)   COLONOSCOPY  08/2022   TAx8, diverticulosis, rpt 3 yrs Elnoria Howard)   TONSILLECTOMY       A IV Location/Drains/Wounds Patient Lines/Drains/Airways Status     Active Line/Drains/Airways     Name Placement date Placement time Site Days   Peripheral IV 05/02/23 20 G Right Antecubital 05/02/23  0905  Antecubital  less than 1            Intake/Output Last 24 hours No intake or output data in the 24 hours ending 05/02/23  1521  Labs/Imaging Results for orders placed or performed during the hospital encounter of 05/02/23 (from the past 48 hours)  Basic metabolic panel     Status: Abnormal   Collection Time: 05/02/23  7:50 AM  Result Value Ref Range   Sodium 141 135 - 145 mmol/L   Potassium 4.3 3.5 - 5.1 mmol/L   Chloride 109 98 - 111 mmol/L   CO2 21 (L) 22 - 32 mmol/L   Glucose, Bld 141 (H) 70 - 99 mg/dL    Comment: Glucose reference range applies only to samples taken after fasting for at least 8 hours.   BUN 11 8 - 23 mg/dL   Creatinine, Ser 2.95 0.61 - 1.24 mg/dL   Calcium 9.7 8.9 - 28.4 mg/dL   GFR, Estimated >13 >24 mL/min    Comment: (NOTE) Calculated using the CKD-EPI Creatinine Equation (2021)    Anion gap 11 5 - 15    Comment: Performed at [facility], [address], [place], [place] [address]  CBC     Status: Abnormal   Collection Time: 05/02/23  7:50 AM  Result Value Ref Range   WBC 14.5 (H) 4.0 - 10.5 K/uL   RBC 5.12 4.22 - 5.81 MIL/uL   Hemoglobin 15.8 13.0 - 17.0 g/dL   HCT 72.5 36.6 - 44.0 %   MCV 88.3 80.0 - 100.0  fL   MCH 30.9 26.0 - 34.0 pg   MCHC 35.0 30.0 - 36.0 g/dL   RDW 95.6 21.3 - 08.6 %   Platelets 259 150 - 400 K/uL   nRBC 0.0 0.0 - 0.2 %    Comment: Performed at [facility], [address], [place], [place] [address]  Troponin I (High Sensitivity)     Status: None   Collection Time: 05/02/23  7:50 AM  Result Value Ref Range   Troponin I (High Sensitivity) 3 <18 ng/L    Comment: (NOTE) Elevated high sensitivity troponin I (hsTnI) values and significant  changes across serial measurements may suggest ACS but many other  chronic and acute conditions are known to elevate hsTnI results.  Refer to the "Links" section for chest pain algorithms and additional  guidance. Performed at [facility], [address] [place], [place] [address]   Hepatic function panel     Status: Abnormal   Collection Time: 05/02/23  9:23 AM  Result Value Ref Range    Total Protein 6.8 6.5 - 8.1 g/dL   Albumin 4.1 3.5 - 5.0 g/dL   AST 26 15 - 41 U/L   ALT 25 0 - 44 U/L   Alkaline Phosphatase 54 38 - 126 U/L   Total Bilirubin 1.2 (H) <1.2 mg/dL   Bilirubin, Direct 0.2 0.0 - 0.2 mg/dL   Indirect Bilirubin 1.0 (H) 0.3 - 0.9 mg/dL    Comment: Performed at [facility], [address] [place], [place] [address]  Lipase, blood     Status: None   Collection Time: 05/02/23  9:23 AM  Result Value Ref Range   Lipase 32 11 - 51 U/L    Comment: Performed at [facility], [address] [place], [place] [address]  Troponin I (High Sensitivity)     Status: None   Collection Time: 05/02/23  9:24 AM  Result Value Ref Range   Troponin I (High Sensitivity) 2 <18 ng/L    Comment: (NOTE) Elevated high sensitivity troponin I (hsTnI) values and significant  changes across serial measurements may suggest ACS but many other  chronic and acute conditions are known to elevate hsTnI results.  Refer to the "Links" section for chest pain algorithms and additional  guidance. Performed at [facility], [address], [place], [place] [address]    US Abdomen Limited RUQ (LIVER/GB) Result Date: 05/02/2023 CLINICAL DATA:  Right upper quadrant pain EXAM: ULTRASOUND ABDOMEN LIMITED RIGHT UPPER QUADRANT COMPARISON:  None Available. FINDINGS: Gallbladder: Dilated gallbladder. Borderline wall thickening. Some layering sludge but no stones. The patient does have pain when scanning of the gallbladder as per the sonographer. Common bile duct: Diameter: 3 mm Liver: No focal lesion identified. Within normal limits in parenchymal echogenicity. Portal vein is patent on color Doppler imaging with normal direction of blood flow towards the liver. Other: None. IMPRESSION: Dilated gallbladder with some sludge but no stones. There is a reported sonographic Murphy's sign. With the level of the dilatation in the pain when scanning of the gallbladder, please correlate with  symptoms of acute cholecystitis or other acute gallbladder pathology. If needed further workup may be useful such as HIDA scan or MRI Electronically Signed   By: Karen Kays M.D.   On: 05/02/2023 10:43   DG Chest 2 View Result Date: 05/02/2023 CLINICAL DATA:  Chest pain and vomiting EXAM: CHEST - 2 VIEW COMPARISON:  Chest radiograph dated 05/22/2010 FINDINGS: Normal lung  volumes. Bilateral lower lung patchy and interstitial opacities. No pleural effusion or pneumothorax. The heart size and mediastinal contours are within normal limits. No acute osseous abnormality. IMPRESSION: Bilateral lower lung patchy and interstitial opacities, which may represent atelectasis, pulmonary edema, or atypical infection. Electronically Signed   By: Agustin Cree M.D.   On: 05/02/2023 08:19    Pending Labs Unresulted Labs (From admission, onward)     Start     Ordered   05/09/23 0500  Creatinine, serum  (enoxaparin (LOVENOX)    CrCl >/= 30 ml/min)  Weekly,   R     Comments: while on enoxaparin therapy    05/02/23 1502   05/03/23 0500  Comprehensive metabolic panel  Tomorrow morning,   R        05/02/23 1502   05/03/23 0500  CBC  Tomorrow morning,   R        05/02/23 1502   05/02/23 1451  HIV Antibody (routine testing w rflx)  (HIV Antibody (Routine testing w reflex) panel)  Once,   R        05/02/23 1502            Vitals/Pain Today's Vitals   05/02/23 0747 05/02/23 1058 05/02/23 1147 05/02/23 1446  BP: (!) 152/80 (!) 150/77  (!) 145/66  Pulse: 62 66  63  Resp: 16 16  16   Temp: 98.4 F (36.9 C) 98.5 F (36.9 C)  98.4 F (36.9 C)  TempSrc:  Oral  Oral  SpO2: 100% 100%  96%  PainSc: 5  8  Asleep     Isolation Precautions No active isolations  Medications Medications  Chlorhexidine Gluconate Cloth 2 % PADS 6 each (has no administration in time range)  cefTRIAXone (ROCEPHIN) 2 g in sodium chloride 0.9 % 100 mL IVPB (0 g Intravenous Stopped 05/02/23 1306)  enoxaparin (LOVENOX) injection 40 mg  (40 mg Subcutaneous Given 05/02/23 1518)  hydrALAZINE (APRESOLINE) injection 10 mg (has no administration in time range)  pantoprazole (PROTONIX) injection 40 mg (has no administration in time range)  simethicone (MYLICON) chewable tablet 40 mg (has no administration in time range)  ondansetron (ZOFRAN-ODT) disintegrating tablet 4 mg (has no administration in time range)    Or  ondansetron (ZOFRAN) injection 4 mg (has no administration in time range)  prochlorperazine (COMPAZINE) tablet 10 mg (has no administration in time range)    Or  prochlorperazine (COMPAZINE) injection 5-10 mg (has no administration in time range)  polyethylene glycol (MIRALAX / GLYCOLAX) packet 17 g (has no administration in time range)  diphenhydrAMINE (BENADRYL) 12.5 MG/5ML elixir 12.5 mg (has no administration in time range)    Or  diphenhydrAMINE (BENADRYL) injection 12.5 mg (has no administration in time range)  methocarbamol (ROBAXIN) tablet 500 mg (has no administration in time range)    Or  methocarbamol (ROBAXIN) injection 500 mg (has no administration in time range)  oxyCODONE (Oxy IR/ROXICODONE) immediate release tablet 5-10 mg (has no administration in time range)  HYDROmorphone (DILAUDID) injection 0.5 mg (has no administration in time range)  acetaminophen (TYLENOL) tablet 650 mg (has no administration in time range)    Or  acetaminophen (TYLENOL) suppository 650 mg (has no administration in time range)  tamsulosin (FLOMAX) capsule 0.4 mg (has no administration in time range)  propranolol (INDERAL) tablet 20 mg (20 mg Oral Given 05/02/23 1517)  melatonin tablet 10 mg (has no administration in time range)  losartan (COZAAR) tablet 50 mg (50 mg Oral Given 05/02/23 1517)  lactated  ringers bolus 1,000 mL (0 mLs Intravenous Stopped 05/02/23 1104)  ondansetron (ZOFRAN) injection 4 mg (4 mg Intravenous Given 05/02/23 0911)  morphine (PF) 4 MG/ML injection 4 mg (4 mg Intravenous Given 05/02/23 0913)   HYDROmorphone (DILAUDID) injection 1 mg (1 mg Intravenous Given 05/02/23 1107)    Mobility walks     Focused Assessments     R Recommendations: See Admitting Provider Note  Report given to:   Additional Notes:

## 2023-05-02 NOTE — ED Notes (Signed)
Called and gave report to nurse accepting patient on the floor.

## 2023-05-02 NOTE — ED Notes (Signed)
Radiology will transport pt to treatment room when done.

## 2023-05-02 NOTE — H&P (Signed)
[facility] Admission Note  Joel Morales 09-Sep-1960  [id number].    Requesting MD: Gwyneth Sprout Chief Complaint/Reason for Consult: cholecystitis   HPI:  Joel Morales is a 62 y.o. male PMH HTN who presented to the ED today for evaluation of abdominal pain. This began around 0400 this morning and awoke him from his sleep. Pain is epigastric and radiates into his chest. No other areas of radiation. Associated with multiple episodes of nausea and vomiting. Denies diaphoresis, fever, chills, dysuria, constipation or SOB. Pain is non-exertional. He has not had any po intake since onset so he is unsure if pain is worse after eating. He tried tums for this without relief. States that he has never had pain like this before. No hx of ulcers. No frequent nsaid use. Denies tobacco use. Reports occasional alcohol use but none recently.   In the ED patient is hypertensive other vital signs stable. Lab work pertinent for elevated WBC at 14.5. LFTs WNL other than slightly elevated total bilirubin of 1.2. Lipase WNL. Troponins are negative and EKG sinus bradycardia. He has mild bilateral lower lung patchy and interstitial opacities. U/s shows a dilated gallbladder with some sludge but no stones, borderline wall thickening, and positive Murphy's sign; common bile duct normal caliber at 3mm.  General surgery asked to see.  Abdominal surgical history: none Anticoagulants: none Nonsmoker Employment: Outpatient pharmacist   ROS: As above, see hpi  Family History  Problem Relation Age of Onset   Heart disease Mother 86       CABG x 3   Hypertension Mother    COPD Mother    Hypertension Maternal Grandmother    Stroke Maternal Grandmother    Stroke Maternal Grandfather    Cancer Maternal Grandfather        stomach    Past Medical History:  Diagnosis Date   Achilles tendinitis, right leg 07/20/2018   Allergy    Anxiety    Arthritis    Hypertension    Left tennis elbow  07/20/2018    Past Surgical History:  Procedure Laterality Date   COLONOSCOPY  09/2011   TA, diverticulosis, rpt 5 yrs Elnoria Howard)   COLONOSCOPY  08/2022   TAx8, diverticulosis, rpt 3 yrs Elnoria Howard)   TONSILLECTOMY      Social History:  reports that he has never smoked. He has never used smokeless tobacco. He reports current alcohol use. He reports that he does not use drugs.  Allergies: No Known Allergies  (Not in a hospital admission)   Prior to Admission medications   Medication Sig Start Date End Date Taking? Authorizing Provider  diphenhydrAMINE (BENADRYL) 25 MG tablet Take 25 mg by mouth every 6 (six) hours as needed for sleep.   Yes [provider]  fluticasone (FLONASE) 50 MCG/ACT nasal spray Place daily into both nostrils.   Yes [provider]  levocetirizine (XYZAL) 5 MG tablet Take 5 mg by mouth every evening.   Yes [provider]  losartan (COZAAR) 50 MG tablet Take 1 tablet (50 mg total) by mouth daily. 01/02/23  Yes Eustaquio Boyden, MD  Melatonin 10 MG TABS Take 10 mg by mouth at bedtime as needed (sleep).   Yes [provider]  Multiple Vitamin (MULTIVITAMIN) tablet Take 1 tablet by mouth daily.   Yes [provider]  propranolol (INDERAL) 40 MG tablet Take 0.5 tablets (20 mg total) by mouth 2 (two) times daily. 01/02/23  Yes Eustaquio Boyden, MD  tamsulosin (FLOMAX) 0.4 MG CAPS  capsule Take 1 capsule (0.4 mg total) by mouth daily after supper. 01/02/23  Yes Eustaquio Boyden, MD    Blood pressure (!) 150/77, pulse 66, temperature 98.5 F (36.9 C), temperature source Oral, resp. rate 16, SpO2 100%. Physical Exam: General: pleasant, WD/WN male who is laying in bed in NAD HEENT: head is normocephalic, atraumatic.  Sclera are non icteric.   Heart: regular, rate, and rhythm.  Lungs: CTAB, no wheezes, rhonchi, or rales noted.  Respiratory effort nonlabored Abd: Soft, ND, RUQ ttp, +BS. No masses or organomegaly. Small umbilical  hernia.  MS: no BUE/BLE edema Skin: warm and dry with no masses, lesions, or rashes Psych: A&Ox4 with an appropriate affect Neuro: MAEs, no gross motor or sensory deficits BUE/BLE  Results for orders placed or performed during the hospital encounter of 05/02/23 (from the past 48 hours)  Basic metabolic panel     Status: Abnormal   Collection Time: 05/02/23  7:50 AM  Result Value Ref Range   Sodium 141 135 - 145 mmol/L   Potassium 4.3 3.5 - 5.1 mmol/L   Chloride 109 98 - 111 mmol/L   CO2 21 (L) 22 - 32 mmol/L   Glucose, Bld 141 (H) 70 - 99 mg/dL    Comment: Glucose reference range applies only to samples taken after fasting for at least 8 hours.   BUN 11 8 - 23 mg/dL   Creatinine, Ser 1.61 0.61 - 1.24 mg/dL   Calcium 9.7 8.9 - 09.6 mg/dL   GFR, Estimated >04 >54 mL/min    Comment: (NOTE) Calculated using the CKD-EPI Creatinine Equation (2021)    Anion gap 11 5 - 15    Comment: Performed at [facility], [address] [place], [place] [address]  CBC     Status: Abnormal   Collection Time: 05/02/23  7:50 AM  Result Value Ref Range   WBC 14.5 (H) 4.0 - 10.5 K/uL   RBC 5.12 4.22 - 5.81 MIL/uL   Hemoglobin 15.8 13.0 - 17.0 g/dL   HCT 91.4 78.2 - 95.6 %   MCV 88.3 80.0 - 100.0 fL   MCH 30.9 26.0 - 34.0 pg   MCHC 35.0 30.0 - 36.0 g/dL   RDW 21.3 08.6 - 57.8 %   Platelets 259 150 - 400 K/uL   nRBC 0.0 0.0 - 0.2 %    Comment: Performed at [facility], [address], [place], [place] [address]  Troponin I (High Sensitivity)     Status: None   Collection Time: 05/02/23  7:50 AM  Result Value Ref Range   Troponin I (High Sensitivity) 3 <18 ng/L    Comment: (NOTE) Elevated high sensitivity troponin I (hsTnI) values and significant  changes across serial measurements may suggest ACS but many other  chronic and acute conditions are known to elevate hsTnI results.  Refer to the "Links" section for chest pain algorithms and additional  guidance. Performed at [facility], [address], [place], [place] [address]   Hepatic function panel     Status: Abnormal   Collection Time: 05/02/23  9:23 AM  Result Value Ref Range   Total Protein 6.8 6.5 - 8.1 g/dL   Albumin 4.1 3.5 - 5.0 g/dL   AST 26 15 - 41 U/L   ALT 25 0 - 44 U/L   Alkaline Phosphatase 54 38 - 126 U/L   Total Bilirubin 1.2 (H) <1.2 mg/dL   Bilirubin, Direct 0.2 0.0 - 0.2 mg/dL  Indirect Bilirubin 1.0 (H) 0.3 - 0.9 mg/dL    Comment: Performed at [facility], [address] [place], [place] [address]  Lipase, blood     Status: None   Collection Time: 05/02/23  9:23 AM  Result Value Ref Range   Lipase 32 11 - 51 U/L    Comment: Performed at [facility], [address], [place], [place] [address]  Troponin I (High Sensitivity)     Status: None   Collection Time: 05/02/23  9:24 AM  Result Value Ref Range   Troponin I (High Sensitivity) 2 <18 ng/L    Comment: (NOTE) Elevated high sensitivity troponin I (hsTnI) values and significant  changes across serial measurements may suggest ACS but many other  chronic and acute conditions are known to elevate hsTnI results.  Refer to the "Links" section for chest pain algorithms and additional  guidance. Performed at [facility], [address] [place], [place] [address]    US Abdomen Limited RUQ (LIVER/GB) Result Date: 05/02/2023 CLINICAL DATA:  Right upper quadrant pain EXAM: ULTRASOUND ABDOMEN LIMITED RIGHT UPPER QUADRANT COMPARISON:  None Available. FINDINGS: Gallbladder: Dilated gallbladder. Borderline wall thickening. Some layering sludge but no stones. The patient does have pain when scanning of the gallbladder as per the sonographer. Common bile duct: Diameter: 3 mm Liver: No focal lesion identified. Within normal limits in parenchymal echogenicity. Portal vein is patent on color Doppler imaging with normal direction of blood flow towards the liver. Other: None. IMPRESSION: Dilated gallbladder with some sludge but  no stones. There is a reported sonographic Murphy's sign. With the level of the dilatation in the pain when scanning of the gallbladder, please correlate with symptoms of acute cholecystitis or other acute gallbladder pathology. If needed further workup may be useful such as HIDA scan or MRI Electronically Signed   By: Karen Kays M.D.   On: 05/02/2023 10:43   DG Chest 2 View Result Date: 05/02/2023 CLINICAL DATA:  Chest pain and vomiting EXAM: CHEST - 2 VIEW COMPARISON:  Chest radiograph dated 05/22/2010 FINDINGS: Normal lung volumes. Bilateral lower lung patchy and interstitial opacities. No pleural effusion or pneumothorax. The heart size and mediastinal contours are within normal limits. No acute osseous abnormality. IMPRESSION: Bilateral lower lung patchy and interstitial opacities, which may represent atelectasis, pulmonary edema, or atypical infection. Electronically Signed   By: Agustin Cree M.D.   On: 05/02/2023 08:19      Assessment/Plan Acute Cholecystitis  - Patient with clinical and radiographic findings consistent with acute cholecystitis. His total bilirubin is mildly elevated at 1.2, but common bile duct is only 3mm on u/s.  Recommend laparoscopic cholecystectomy with possible IOC. I have explained the procedure, risks, and aftercare of Laparoscopic cholecystectomy with IOC.  Risks include but are not limited to anesthesia (MI, CVA, death, prolonged intubation and aspiration), bleeding, infection, wound problems, hernia, bile leak, injury to common bile duct/liver/intestine, possible need for subtotal cholecystectomy or open cholecystectomy, increased risk of DVT/PE and diarrhea post op.  He seems to understand and agrees to proceed. Keep NPO for now and start IV antibiotics. Possible surgery today pending OR availability.   ID - Rocephin VTE - SCDs, plan for lovenox postop if remains inpatient FEN - NPO Foley - none  HTN  I reviewed nursing notes, last 24 h vitals and pain scores,  last 48 h intake and output, last 24 h labs and trends, and last 24 h imaging results.   Leary Roca,  PA-C [facility] 05/02/2023, 12:15 PM Please see Amion for pager number during day hours 7:00am-4:30pm

## 2023-05-02 NOTE — ED Triage Notes (Signed)
Pt came in POV for Abd pain at 4a.  Vomiting at 5a,  CP followed the vomiting.  5/10 CP and 5/10 ABD pain are present now.  CP is centralized, non-radiating.   Pt has hx of HTN but no cardiac hx.

## 2023-05-02 NOTE — ED Notes (Signed)
PT well appearing upon transport to floor.

## 2023-05-02 NOTE — ED Provider Notes (Signed)
[facility] Provider Note   CSN: [id number] Arrival date & time: 05/02/23  5643     History  No chief complaint on file.   Joel Morales is a 62 y.o. male.  Patient is a 62 year old male with a history of hypertension who is presenting today with complaints of vomiting and abdominal/chest pain.  Patient reports that he felt fine when he went to bed last night but woke up about 5 AM this morning with a discomfort in his epigastric area that went up into his chest and vomiting since 6 AM.  He has had multiple episodes of retching with emesis that is now turned yellow.  Last normal bowel movement was yesterday.  No noted fevers.  Denies any cough or shortness of breath.  No prior abdominal surgeries.  No known sick contacts.  No new medications.  Last time he took his blood pressure medicine was yesterday.  He has no known cardiac history but does report his mom had a bypass.  The history is provided by the patient.       Home Medications Prior to Admission medications   Medication Sig Start Date End Date Taking? Authorizing Provider  diphenhydrAMINE (BENADRYL) 25 MG tablet Take 25 mg by mouth every 6 (six) hours as needed for sleep.   Yes [provider]  fluticasone (FLONASE) 50 MCG/ACT nasal spray Place daily into both nostrils.   Yes [provider]  levocetirizine (XYZAL) 5 MG tablet Take 5 mg by mouth every evening.   Yes [provider]  losartan (COZAAR) 50 MG tablet Take 1 tablet (50 mg total) by mouth daily. 01/02/23  Yes Eustaquio Boyden, MD  Melatonin 10 MG TABS Take 10 mg by mouth at bedtime as needed (sleep).   Yes [provider]  Multiple Vitamin (MULTIVITAMIN) tablet Take 1 tablet by mouth daily.   Yes [provider]  propranolol (INDERAL) 40 MG tablet Take 0.5 tablets (20 mg total) by mouth 2 (two) times daily. 01/02/23  Yes Eustaquio Boyden, MD  tamsulosin (FLOMAX) 0.4 MG  CAPS capsule Take 1 capsule (0.4 mg total) by mouth daily after supper. 01/02/23  Yes Eustaquio Boyden, MD      Allergies    Patient has no known allergies.    Review of Systems   Review of Systems  Physical Exam Updated Vital Signs BP (!) 150/77 (BP Location: Left Arm)   Pulse 66   Temp 98.5 F (36.9 C) (Oral)   Resp 16   SpO2 100%  Physical Exam Vitals and nursing note reviewed.  Constitutional:      General: He is not in acute distress.    Appearance: He is well-developed.  HENT:     Head: Normocephalic and atraumatic.  Eyes:     Conjunctiva/sclera: Conjunctivae normal.     Pupils: Pupils are equal, round, and reactive to light.  Cardiovascular:     Rate and Rhythm: Normal rate and regular rhythm.     Heart sounds: No murmur heard. Pulmonary:     Effort: Pulmonary effort is normal. No respiratory distress.     Breath sounds: Normal breath sounds. No wheezing or rales.  Abdominal:     General: There is no distension.     Palpations: Abdomen is soft.     Tenderness: There is abdominal tenderness in the right upper quadrant and epigastric area. There is guarding. There is no right CVA tenderness, left CVA tenderness or rebound. Positive signs include  Murphy's sign.  Musculoskeletal:        General: No tenderness. Normal range of motion.     Cervical back: Normal range of motion and neck supple.  Skin:    General: Skin is warm and dry.     Findings: No erythema or rash.  Neurological:     Mental Status: He is alert and oriented to person, place, and time. Mental status is at baseline.  Psychiatric:        Behavior: Behavior normal.     ED Results / Procedures / Treatments   Labs (all labs ordered are listed, but only abnormal results are displayed) Labs Reviewed  BASIC METABOLIC PANEL - Abnormal; Notable for the following components:      Result Value   CO2 21 (*)    Glucose, Bld 141 (*)    All other components within normal limits  CBC - Abnormal; Notable  for the following components:   WBC 14.5 (*)    All other components within normal limits  HEPATIC FUNCTION PANEL - Abnormal; Notable for the following components:   Total Bilirubin 1.2 (*)    Indirect Bilirubin 1.0 (*)    All other components within normal limits  LIPASE, BLOOD  TROPONIN I (HIGH SENSITIVITY)  TROPONIN I (HIGH SENSITIVITY)    EKG EKG Interpretation Date/Time:  Thursday May 02 2023 07:41:29 EST Ventricular Rate:  58 PR Interval:  178 QRS Duration:  90 QT Interval:  416 QTC Calculation: 408 R Axis:   -13  Text Interpretation: Sinus bradycardia Otherwise normal ECG No previous ECGs available Confirmed by Gwyneth Sprout ([id number]) on 05/02/2023 8:31:19 AM  Radiology US Abdomen Limited RUQ (LIVER/GB) Result Date: 05/02/2023 CLINICAL DATA:  Right upper quadrant pain EXAM: ULTRASOUND ABDOMEN LIMITED RIGHT UPPER QUADRANT COMPARISON:  None Available. FINDINGS: Gallbladder: Dilated gallbladder. Borderline wall thickening. Some layering sludge but no stones. The patient does have pain when scanning of the gallbladder as per the sonographer. Common bile duct: Diameter: 3 mm Liver: No focal lesion identified. Within normal limits in parenchymal echogenicity. Portal vein is patent on color Doppler imaging with normal direction of blood flow towards the liver. Other: None. IMPRESSION: Dilated gallbladder with some sludge but no stones. There is a reported sonographic Murphy's sign. With the level of the dilatation in the pain when scanning of the gallbladder, please correlate with symptoms of acute cholecystitis or other acute gallbladder pathology. If needed further workup may be useful such as HIDA scan or MRI Electronically Signed   By: Karen Kays M.D.   On: 05/02/2023 10:43   DG Chest 2 View Result Date: 05/02/2023 CLINICAL DATA:  Chest pain and vomiting EXAM: CHEST - 2 VIEW COMPARISON:  Chest radiograph dated 05/22/2010 FINDINGS: Normal lung volumes. Bilateral lower lung  patchy and interstitial opacities. No pleural effusion or pneumothorax. The heart size and mediastinal contours are within normal limits. No acute osseous abnormality. IMPRESSION: Bilateral lower lung patchy and interstitial opacities, which may represent atelectasis, pulmonary edema, or atypical infection. Electronically Signed   By: Agustin Cree M.D.   On: 05/02/2023 08:19    Procedures Procedures    Medications Ordered in ED Medications  Chlorhexidine Gluconate Cloth 2 % PADS 6 each (has no administration in time range)  cefTRIAXone (ROCEPHIN) 2 g in sodium chloride 0.9 % 100 mL IVPB (2 g Intravenous New Bag/Given 05/02/23 1236)  ondansetron (ZOFRAN) injection 4 mg (has no administration in time range)  HYDROmorphone (DILAUDID) injection 1 mg (has no administration in time  range)  lactated ringers bolus 1,000 mL (0 mLs Intravenous Stopped 05/02/23 1104)  ondansetron (ZOFRAN) injection 4 mg (4 mg Intravenous Given 05/02/23 0911)  morphine (PF) 4 MG/ML injection 4 mg (4 mg Intravenous Given 05/02/23 0913)  HYDROmorphone (DILAUDID) injection 1 mg (1 mg Intravenous Given 05/02/23 1107)    ED Course/ Medical Decision Making/ A&P                                 Medical Decision Making Amount and/or Complexity of Data Reviewed Labs: ordered. Decision-making details documented in ED Course. Radiology: ordered and independent interpretation performed. Decision-making details documented in ED Course. ECG/medicine tests: ordered and independent interpretation performed. Decision-making details documented in ED Course.  Risk Prescription drug management. Decision regarding hospitalization.   Pt with multiple medical problems and comorbidities and presenting today with a complaint that caries a high risk for morbidity and mortality.  Here today with sudden onset of abdominal/chest pain and vomiting.  On exam patient's findings are concerning for cholecystitis with right upper quadrant pain and  Murphy sign.  However also concern for esophageal spasm/GERD, viral etiology with gastritis, ACS, kidney stone.  Patient denies any respiratory symptoms and lower suspicion for pneumonia, PE.  Lower suspicion for dissection at this time.  I independently interpreted patient's EKG and labs.  EKG is in normal, CBC with leukocytosis of 14 with stable hemoglobin and platelets, troponin initially normal at 3, BMP with a normal creatinine and electrolytes.  However LFTs and lipase are pending.  Right upper quadrant ultrasound pending. I have independently visualized and interpreted pt's images today.  Chest x-ray today without acute findings.  Radiology does report lower patchy interstitial opacities but feel that that is most likely atelectasis. Patient given pain and nausea control.  12:50 PM LFTs are normal except for a total bilirubin of 1.2, lipase is normal.  Ultrasound shows dilated gallbladder with some sludge but no visible stones.  Also patient has a sonographic Murphy sign and radiology reports due to the level of dilation concern for acute cholecystitis.  On repeat exam patient is still having significant abdominal pain.  Will consult general surgery. Pt will be admitted for acute cholecystitis.         Final Clinical Impression(s) / ED Diagnoses Final diagnoses:  Acute cholecystitis    Rx / DC Orders ED Discharge Orders     None         Gwyneth Sprout, MD 05/02/23 1250

## 2023-05-03 ENCOUNTER — Observation Stay (HOSPITAL_COMMUNITY): Payer: No Typology Code available for payment source

## 2023-05-03 ENCOUNTER — Observation Stay (HOSPITAL_COMMUNITY): Payer: No Typology Code available for payment source | Admitting: Certified Registered Nurse Anesthetist

## 2023-05-03 ENCOUNTER — Other Ambulatory Visit: Payer: Self-pay

## 2023-05-03 ENCOUNTER — Observation Stay (HOSPITAL_BASED_OUTPATIENT_CLINIC_OR_DEPARTMENT_OTHER): Payer: No Typology Code available for payment source | Admitting: Certified Registered Nurse Anesthetist

## 2023-05-03 ENCOUNTER — Encounter (HOSPITAL_COMMUNITY)
Admission: EM | Disposition: A | Discharge status: Home / Self Care | Payer: Self-pay | Source: Home / Self Care | Attending: Emergency Medicine

## 2023-05-03 ENCOUNTER — Encounter (HOSPITAL_COMMUNITY): Payer: Self-pay

## 2023-05-03 DIAGNOSIS — K8000 Calculus of gallbladder with acute cholecystitis without obstruction: Secondary | ICD-10-CM

## 2023-05-03 HISTORY — PX: CHOLECYSTECTOMY: SHX55

## 2023-05-03 LAB — COMPREHENSIVE METABOLIC PANEL
ALT: 22 U/L (ref 0–44)
AST: 24 U/L (ref 15–41)
Albumin: 3.6 g/dL (ref 3.5–5.0)
Alkaline Phosphatase: 48 U/L (ref 38–126)
Anion gap: 6 (ref 5–15)
BUN: 8 mg/dL (ref 8–23)
CO2: 27 mmol/L (ref 22–32)
Calcium: 8.8 mg/dL — ABNORMAL LOW (ref 8.9–10.3)
Chloride: 103 mmol/L (ref 98–111)
Creatinine, Ser: 1.14 mg/dL (ref 0.61–1.24)
GFR, Estimated: >60 mL/min (ref >60)
Glucose, Bld: 105 mg/dL — ABNORMAL HIGH (ref 70–99)
Potassium: 3.5 mmol/L (ref 3.5–5.1)
Sodium: 136 mmol/L (ref 135–145)
Total Bilirubin: 1.9 mg/dL — ABNORMAL HIGH (ref <1.2)
Total Protein: 6.2 g/dL — ABNORMAL LOW (ref 6.5–8.1)

## 2023-05-03 LAB — CBC
HCT: 44.8 % (ref 39.0–52.0)
Hemoglobin: 15.3 g/dL (ref 13.0–17.0)
MCH: 30.8 pg (ref 26.0–34.0)
MCHC: 34.2 g/dL (ref 30.0–36.0)
MCV: 90.3 fL (ref 80.0–100.0)
Platelets: 249 10*3/uL (ref 150–400)
RBC: 4.96 MIL/uL (ref 4.22–5.81)
RDW: 13.3 % (ref 11.5–15.5)
WBC: 11.7 10*3/uL — ABNORMAL HIGH (ref 4.0–10.5)
nRBC: 0 % (ref 0.0–0.2)

## 2023-05-03 SURGERY — LAPAROSCOPIC CHOLECYSTECTOMY WITH INTRAOPERATIVE CHOLANGIOGRAM
Anesthesia: General | Site: Abdomen

## 2023-05-03 MED ORDER — MIDAZOLAM HCL 2 MG/2ML IJ SOLN
INTRAMUSCULAR | Status: AC
  Filled 2023-05-03: qty 2

## 2023-05-03 MED ORDER — EPHEDRINE SULFATE-NACL 50-0.9 MG/10ML-% IV SOSY
PREFILLED_SYRINGE | INTRAVENOUS | Status: DC | PRN
  Administered 2023-05-03 (×4): 5 mg via INTRAVENOUS

## 2023-05-03 MED ORDER — KETOROLAC TROMETHAMINE 30 MG/ML IJ SOLN
INTRAMUSCULAR | Status: AC
  Filled 2023-05-03: qty 1

## 2023-05-03 MED ORDER — ROCURONIUM BROMIDE 10 MG/ML (PF) SYRINGE
PREFILLED_SYRINGE | INTRAVENOUS | Status: AC
  Filled 2023-05-03: qty 10

## 2023-05-03 MED ORDER — LACTATED RINGERS IV SOLN: INTRAVENOUS | Status: DC

## 2023-05-03 MED ORDER — IBUPROFEN 600 MG PO TABS
600.0000 mg | ORAL_TABLET | Freq: Three times a day (TID) | ORAL | Status: DC
  Administered 2023-05-03: 600 mg via ORAL
  Filled 2023-05-03: qty 1

## 2023-05-03 MED ORDER — POLYETHYLENE GLYCOL 3350 17 G PO PACK: 17.0000 g | PACK | Freq: Every day | ORAL | 0 refills | Status: DC | PRN

## 2023-05-03 MED ORDER — FENTANYL CITRATE (PF) 100 MCG/2ML IJ SOLN: 25.0000 ug | INTRAMUSCULAR | Status: DC | PRN

## 2023-05-03 MED ORDER — OXYCODONE HCL 5 MG PO TABS: 5.0000 mg | ORAL_TABLET | Freq: Once | ORAL | Status: DC | PRN

## 2023-05-03 MED ORDER — ONDANSETRON HCL 4 MG/2ML IJ SOLN
INTRAMUSCULAR | Status: DC | PRN
  Administered 2023-05-03: 4 mg via INTRAVENOUS

## 2023-05-03 MED ORDER — DEXAMETHASONE SODIUM PHOSPHATE 10 MG/ML IJ SOLN
INTRAMUSCULAR | Status: DC | PRN
  Administered 2023-05-03: 5 mg via INTRAVENOUS

## 2023-05-03 MED ORDER — ONDANSETRON HCL 4 MG/2ML IJ SOLN
INTRAMUSCULAR | Status: AC
  Filled 2023-05-03: qty 2

## 2023-05-03 MED ORDER — MIDAZOLAM HCL 5 MG/5ML IJ SOLN
INTRAMUSCULAR | Status: DC | PRN
  Administered 2023-05-03: 2 mg via INTRAVENOUS

## 2023-05-03 MED ORDER — LIDOCAINE 2% (20 MG/ML) 5 ML SYRINGE
INTRAMUSCULAR | Status: DC | PRN
  Administered 2023-05-03: 60 mg via INTRAVENOUS

## 2023-05-03 MED ORDER — PROPOFOL 10 MG/ML IV BOLUS
INTRAVENOUS | Status: DC | PRN
  Administered 2023-05-03: 170 mg via INTRAVENOUS

## 2023-05-03 MED ORDER — SODIUM CHLORIDE 0.9 % IV SOLN
INTRAVENOUS | Status: DC | PRN
  Administered 2023-05-03: 17 mL

## 2023-05-03 MED ORDER — SUGAMMADEX SODIUM 200 MG/2ML IV SOLN
INTRAVENOUS | Status: DC | PRN
  Administered 2023-05-03: 200 mg via INTRAVENOUS

## 2023-05-03 MED ORDER — IBUPROFEN 600 MG PO TABS: 600.0000 mg | ORAL_TABLET | Freq: Three times a day (TID) | ORAL | 0 refills | Status: DC

## 2023-05-03 MED ORDER — CHLORHEXIDINE GLUCONATE 0.12 % MT SOLN
15.0000 mL | Freq: Once | OROMUCOSAL | Status: AC
  Administered 2023-05-03: 15 mL via OROMUCOSAL

## 2023-05-03 MED ORDER — BUPIVACAINE-EPINEPHRINE 0.25% -1:200000 IJ SOLN
INTRAMUSCULAR | Status: DC | PRN
  Administered 2023-05-03: 15 mL

## 2023-05-03 MED ORDER — ACETAMINOPHEN 500 MG PO TABS
1000.0000 mg | ORAL_TABLET | Freq: Four times a day (QID) | ORAL | Status: DC
  Administered 2023-05-03: 1000 mg via ORAL
  Filled 2023-05-03: qty 2

## 2023-05-03 MED ORDER — OXYCODONE HCL 5 MG/5ML PO SOLN: 5.0000 mg | Freq: Once | ORAL | Status: DC | PRN

## 2023-05-03 MED ORDER — FENTANYL CITRATE (PF) 250 MCG/5ML IJ SOLN
INTRAMUSCULAR | Status: AC
  Filled 2023-05-03: qty 5

## 2023-05-03 MED ORDER — 0.9 % SODIUM CHLORIDE (POUR BTL) OPTIME
TOPICAL | Status: DC | PRN
  Administered 2023-05-03: 1000 mL

## 2023-05-03 MED ORDER — ROCURONIUM BROMIDE 10 MG/ML (PF) SYRINGE
PREFILLED_SYRINGE | INTRAVENOUS | Status: DC | PRN
  Administered 2023-05-03: 10 mg via INTRAVENOUS
  Administered 2023-05-03: 20 mg via INTRAVENOUS
  Administered 2023-05-03: 50 mg via INTRAVENOUS

## 2023-05-03 MED ORDER — OXYCODONE HCL 5 MG PO TABS: 5.0000 mg | ORAL_TABLET | Freq: Four times a day (QID) | ORAL | 0 refills | Status: DC | PRN

## 2023-05-03 MED ORDER — ORAL CARE MOUTH RINSE: 15.0000 mL | Freq: Once | OROMUCOSAL | Status: AC

## 2023-05-03 MED ORDER — BUPIVACAINE-EPINEPHRINE (PF) 0.25% -1:200000 IJ SOLN
INTRAMUSCULAR | Status: AC
  Filled 2023-05-03: qty 30

## 2023-05-03 MED ORDER — PHENYLEPHRINE 80 MCG/ML (10ML) SYRINGE FOR IV PUSH (FOR BLOOD PRESSURE SUPPORT)
PREFILLED_SYRINGE | INTRAVENOUS | Status: DC | PRN
  Administered 2023-05-03 (×2): 80 ug via INTRAVENOUS
  Administered 2023-05-03: 160 ug via INTRAVENOUS
  Administered 2023-05-03: 80 ug via INTRAVENOUS
  Administered 2023-05-03: 240 ug via INTRAVENOUS

## 2023-05-03 MED ORDER — SODIUM CHLORIDE 0.9 % IR SOLN
Status: DC | PRN
  Administered 2023-05-03: 1

## 2023-05-03 MED ORDER — PROPOFOL 10 MG/ML IV BOLUS
INTRAVENOUS | Status: AC
  Filled 2023-05-03: qty 20

## 2023-05-03 MED ORDER — CHLORHEXIDINE GLUCONATE 0.12 % MT SOLN
OROMUCOSAL | Status: AC
  Filled 2023-05-03: qty 15

## 2023-05-03 MED ORDER — DEXAMETHASONE SODIUM PHOSPHATE 10 MG/ML IJ SOLN
INTRAMUSCULAR | Status: AC
  Filled 2023-05-03: qty 1

## 2023-05-03 MED ORDER — FENTANYL CITRATE (PF) 250 MCG/5ML IJ SOLN
INTRAMUSCULAR | Status: DC | PRN
  Administered 2023-05-03 (×3): 50 ug via INTRAVENOUS
  Administered 2023-05-03: 100 ug via INTRAVENOUS

## 2023-05-03 MED ORDER — PHENYLEPHRINE 80 MCG/ML (10ML) SYRINGE FOR IV PUSH (FOR BLOOD PRESSURE SUPPORT)
PREFILLED_SYRINGE | INTRAVENOUS | Status: AC
  Filled 2023-05-03: qty 10

## 2023-05-03 MED ORDER — KETOROLAC TROMETHAMINE 30 MG/ML IJ SOLN
INTRAMUSCULAR | Status: DC | PRN
  Administered 2023-05-03: 30 mg via INTRAVENOUS

## 2023-05-03 MED ORDER — SODIUM CHLORIDE 0.9 % IV SOLN: INTRAVENOUS | Status: DC | PRN

## 2023-05-03 MED ORDER — LIDOCAINE 2% (20 MG/ML) 5 ML SYRINGE
INTRAMUSCULAR | Status: AC
  Filled 2023-05-03: qty 5

## 2023-05-03 MED ORDER — ACETAMINOPHEN 500 MG PO TABS: 1000.0000 mg | ORAL_TABLET | Freq: Four times a day (QID) | ORAL | 0 refills | Status: DC

## 2023-05-03 MED ORDER — ONDANSETRON HCL 4 MG/2ML IJ SOLN: 4.0000 mg | Freq: Four times a day (QID) | INTRAMUSCULAR | Status: DC | PRN

## 2023-05-03 SURGICAL SUPPLY — 40 items
APPLIER CLIP ROT 10 11.4 M/L (STAPLE) ×1
BAG COUNTER SPONGE SURGICOUNT (BAG) ×1 IMPLANT
BENZOIN TINCTURE PRP APPL 2/3 (GAUZE/BANDAGES/DRESSINGS) ×1 IMPLANT
BLADE CLIPPER SURG (BLADE) IMPLANT
CANISTER SUCT 3000ML PPV (MISCELLANEOUS) ×1 IMPLANT
CHLORAPREP W/TINT 26 (MISCELLANEOUS) ×1 IMPLANT
CLIP APPLIE ROT 10 11.4 M/L (STAPLE) ×1 IMPLANT
COVER MAYO STAND STRL (DRAPES) ×1 IMPLANT
COVER SURGICAL LIGHT HANDLE (MISCELLANEOUS) ×1 IMPLANT
DRAPE C-ARM 42X120 X-RAY (DRAPES) ×1 IMPLANT
DRSG TEGADERM 2-3/8X2-3/4 SM (GAUZE/BANDAGES/DRESSINGS) ×3 IMPLANT
DRSG TEGADERM 4X4.75 (GAUZE/BANDAGES/DRESSINGS) ×1 IMPLANT
ELECT REM PT RETURN 9FT ADLT (ELECTROSURGICAL) ×1
ELECTRODE REM PT RTRN 9FT ADLT (ELECTROSURGICAL) ×1 IMPLANT
GAUZE SPONGE 2X2 8PLY STRL LF (GAUZE/BANDAGES/DRESSINGS) ×1 IMPLANT
GLOVE BIO SURGEON STRL SZ7 (GLOVE) ×1 IMPLANT
GLOVE BIOGEL PI IND STRL 7.5 (GLOVE) ×1 IMPLANT
GOWN STRL REUS W/ TWL LRG LVL3 (GOWN DISPOSABLE) ×3 IMPLANT
IRRIG SUCT STRYKERFLOW 2 WTIP (MISCELLANEOUS) ×1
IRRIGATION SUCT STRKRFLW 2 WTP (MISCELLANEOUS) ×1 IMPLANT
KIT BASIN OR (CUSTOM PROCEDURE TRAY) ×1 IMPLANT
KIT TURNOVER KIT B (KITS) ×1 IMPLANT
NS IRRIG 1000ML POUR BTL (IV SOLUTION) ×1 IMPLANT
PAD ARMBOARD 7.5X6 YLW CONV (MISCELLANEOUS) ×1 IMPLANT
POUCH RETRIEVAL ECOSAC 10 (ENDOMECHANICALS) IMPLANT
SCISSORS LAP 5X35 DISP (ENDOMECHANICALS) ×1 IMPLANT
SET CHOLANGIOGRAPH 5 50 .035 (SET/KITS/TRAYS/PACK) ×1 IMPLANT
SET TUBE SMOKE EVAC HIGH FLOW (TUBING) ×1 IMPLANT
SLEEVE Z-THREAD 5X100MM (TROCAR) ×1 IMPLANT
SPECIMEN JAR SMALL (MISCELLANEOUS) ×1 IMPLANT
STRIP CLOSURE SKIN 1/2X4 (GAUZE/BANDAGES/DRESSINGS) ×1 IMPLANT
SUT MNCRL AB 4-0 PS2 18 (SUTURE) ×1 IMPLANT
TOWEL GREEN STERILE (TOWEL DISPOSABLE) ×1 IMPLANT
TOWEL GREEN STERILE FF (TOWEL DISPOSABLE) ×1 IMPLANT
TRAY LAPAROSCOPIC MC (CUSTOM PROCEDURE TRAY) ×1 IMPLANT
TROCAR 11X100 Z THREAD (TROCAR) ×1 IMPLANT
TROCAR BALLN 12MMX100 BLUNT (TROCAR) ×1 IMPLANT
TROCAR Z-THREAD OPTICAL 5X100M (TROCAR) ×1 IMPLANT
WARMER LAPAROSCOPE (MISCELLANEOUS) ×1 IMPLANT
WATER STERILE IRR 1000ML POUR (IV SOLUTION) ×1 IMPLANT

## 2023-05-03 NOTE — Plan of Care (Signed)
  Problem: Education: Goal: Knowledge of General Education information will improve Description: Including pain rating scale, medication(s)/side effects and non-pharmacologic comfort measures Outcome: Progressing   Problem: Clinical Measurements: Goal: Ability to maintain clinical measurements within normal limits will improve Outcome: Progressing   Problem: Nutrition: Goal: Adequate nutrition will be maintained Outcome: Progressing   Problem: Coping: Goal: Level of anxiety will decrease Outcome: Progressing   Problem: Elimination: Goal: Will not experience complications related to bowel motility Outcome: Progressing   Problem: Pain Management: Goal: General experience of comfort will improve Outcome: Progressing   Problem: Safety: Goal: Ability to remain free from injury will improve Outcome: Progressing

## 2023-05-03 NOTE — Anesthesia Postprocedure Evaluation (Signed)
Anesthesia Post Note  Patient: Joel Morales  Procedure(s) Performed: LAPAROSCOPIC CHOLECYSTECTOMY WITH INTRAOPERATIVE CHOLANGIOGRAM (Abdomen)     Patient location during evaluation: PACU Anesthesia Type: General Level of consciousness: awake and alert Pain management: pain level controlled Vital Signs Assessment: post-procedure vital signs reviewed and stable Respiratory status: spontaneous breathing, nonlabored ventilation, respiratory function stable and patient connected to nasal cannula oxygen Cardiovascular status: blood pressure returned to baseline and stable Postop Assessment: no apparent nausea or vomiting Anesthetic complications: no   No notable events documented.  Last Vitals:  Vitals:   05/03/23 1003 05/03/23 1005  BP: (!) 115/58 (!) 115/58  Pulse: 71 71  Resp:  15  Temp:  37.3 C  SpO2: 97% 96%    Last Pain:  Vitals:   05/03/23 1005  TempSrc: Oral  PainSc:                  Lorina Duffner S

## 2023-05-03 NOTE — Progress Notes (Signed)
Patriciaann Clan be D/C'd per MD order. Discussed with the patient and all questions fully answered. ? VSS, Skin clean, dry and intact without evidence of skin break down, no evidence of skin tears noted. ? IV catheter discontinued intact. Site without signs and symptoms of complications. Dressing and pressure applied. ? An After Visit Summary was printed and given to the patient. Patient informed where to pickup prescriptions. ? D/c education completed with patient/family including follow up instructions, medication list, d/c activities limitations if indicated, with other d/c instructions as indicated by MD - patient able to verbalize understanding, all questions fully answered.  ? Patient instructed to return to ED, call 911, or call MD for any changes in condition.  ? Patient to be escorted via WC, and D/C home via private auto.

## 2023-05-03 NOTE — Discharge Instructions (Signed)

## 2023-05-03 NOTE — Interval H&P Note (Signed)
History and Physical Interval Note:  05/03/2023 7:07 AM  Joel Morales  has presented today for surgery, with the diagnosis of acute cholecystitis.  The various methods of treatment have been discussed with the patient and family. After consideration of risks, benefits and other options for treatment, the patient has consented to  Procedure(s): LAPAROSCOPIC CHOLECYSTECTOMY WITH INTRAOPERATIVE CHOLANGIOGRAM (N/A) as a surgical intervention.  The patient's history has been reviewed, patient examined, no change in status, stable for surgery.  I have reviewed the patient's chart and labs.  Questions were answered to the patient's satisfaction.     Wynona Luna

## 2023-05-03 NOTE — Op Note (Signed)
Laparoscopic Cholecystectomy with IOC Procedure Note  Indications: This patient presents with symptomatic gallbladder disease and will undergo laparoscopic cholecystectomy.  Pre-operative Diagnosis: Calculus of gallbladder with acute cholecystitis, without mention of obstruction  Post-operative Diagnosis: Same  Surgeon: Wynona Luna   Assistants: none  Anesthesia: General endotracheal anesthesia  ASA Class: 1  Procedure Details  The patient was seen again in the Holding Room. The risks, benefits, complications, treatment options, and expected outcomes were discussed with the patient. The possibilities of reaction to medication, pulmonary aspiration, perforation of viscus, bleeding, recurrent infection, finding a normal gallbladder, the need for additional procedures, failure to diagnose a condition, the possible need to convert to an open procedure, and creating a complication requiring transfusion or operation were discussed with the patient. The likelihood of improving the patient's symptoms with return to their baseline status is good.  The patient and/or family concurred with the proposed plan, giving informed consent. The site of surgery properly noted. The patient was taken to Operating Room, identified as Joel Morales and the procedure verified as Laparoscopic Cholecystectomy with Intraoperative Cholangiogram. A Time Out was held and the above information confirmed.  Prior to the induction of general anesthesia, antibiotic prophylaxis was administered. General endotracheal anesthesia was then administered and tolerated well. After the induction, the abdomen was prepped with Chloraprep and draped in the sterile fashion. The patient was positioned in the supine position.  Local anesthetic agent was injected into the skin below the umbilicus and an incision made. We dissected down to the abdominal fascia with blunt dissection.  He has a very small umbilical hernia defect at the  umbilicus.  The fascia was incised vertically to include the hernia defect and we entered the peritoneal cavity bluntly.  A pursestring suture of 0-Vicryl was placed around the fascial opening.  The Hasson cannula was inserted and secured with the stay suture.  Pneumoperitoneum was then created with CO2 and tolerated well without any adverse changes in the patient's vital signs. An 11-mm port was placed in the subxiphoid position.  Two 5-mm ports were placed in the right upper quadrant. All skin incisions were infiltrated with a local anesthetic agent before making the incision and placing the trocars.   We positioned the patient in reverse Trendelenburg, tilted slightly to the patient's left.  The gallbladder was identified, the fundus grasped and retracted cephalad.  The gallbladder is inflamed and distended.   Adhesions were lysed bluntly and with the electrocautery where indicated, taking care not to injure any adjacent organs or viscus. The infundibulum was grasped and retracted laterally, exposing the peritoneum overlying the triangle of Calot. This was then divided and exposed in a blunt fashion. A critical view of the cystic duct and cystic artery was obtained.  The cystic duct was clearly identified and bluntly dissected circumferentially. The cystic duct was ligated with a clip distally.   An incision was made in the cystic duct and the [facility] cholangiogram catheter introduced. The catheter was secured using a clip. A cholangiogram was then obtained which showed good visualization of the distal biliary tree with no sign of filling defects or obstruction.  Contrast flowed easily into the duodenum. There is slight leak of contrast around the clip.  There does not seem to be enough backpressure to fill the proximal biliary tree, but I can see the cystic duct entering the common bile duct.  The catheter was then removed.   The cystic duct was then ligated with clips and divided. The cystic  artery was  identified, dissected free, ligated with clips and divided as well.   The gallbladder was dissected from the liver bed in retrograde fashion with the electrocautery. The gallbladder was removed and placed in an Eco sac. The liver bed was irrigated and inspected. Hemostasis was achieved with the electrocautery. Copious irrigation was utilized and was repeatedly aspirated until clear.  The gallbladder and Eco sac were then removed through the umbilical port site.  The pursestring suture was used to close the umbilical fascia.    We again inspected the right upper quadrant for hemostasis.  Pneumoperitoneum was released as we removed the trocars.  4-0 Monocryl was used to close the skin.   Benzoin, steri-strips, and clean dressings were applied. The patient was then extubated and brought to the recovery room in stable condition. Instrument, sponge, and needle counts were correct at closure and at the conclusion of the case.   Findings: Cholecystitis with Cholelithiasis  Estimated Blood Loss: Minimal         Drains: none         Specimens: Gallbladder           Complications: None; patient tolerated the procedure well.         Disposition: PACU - hemodynamically stable.         Condition: stable  Joel Morales. Joel Skains, MD, [facility] Surgery  General Surgery   05/03/2023 8:55 AM

## 2023-05-03 NOTE — Transfer of Care (Signed)
Immediate Anesthesia Transfer of Care Note  Patient: Joel Morales  Procedure(s) Performed: LAPAROSCOPIC CHOLECYSTECTOMY WITH INTRAOPERATIVE CHOLANGIOGRAM (Abdomen)  Patient Location: PACU  Anesthesia Type:General  Level of Consciousness: drowsy and patient cooperative  Airway & Oxygen Therapy: Patient Spontanous Breathing and Patient connected to nasal cannula oxygen  Post-op Assessment: Report given to RN, Post -op Vital signs reviewed and stable, and Patient moving all extremities X 4  Post vital signs: Reviewed and stable  Last Vitals:  Vitals Value Taken Time  BP 112/60 05/03/23 0856  Temp    Pulse 76 05/03/23 0857  Resp 15 05/03/23 0857  SpO2 98 % 05/03/23 0857  Vitals shown include unfiled device data.  Last Pain:  Vitals:   05/03/23 0637  TempSrc: Oral  PainSc:          Complications: No notable events documented.

## 2023-05-03 NOTE — Discharge Summary (Signed)
[facility] Discharge Summary   Patient ID: MASSIMO SHIPES MRN: [id number] DOB/AGE: [birth year] 62 y.o.  Admit date: 05/02/2023 Discharge date: 05/03/2023  Admitting Diagnosis: Cholecystitis   Discharge Diagnosis Patient Active Problem List   Diagnosis Date Noted   Acute cholecystitis 05/02/2023   Elevated PSA 01/02/2023   Family history of coronary artery disease 01/02/2023   Health maintenance examination 12/06/2020   Tremor 07/12/2020   Chronic elbow pain, left 12/04/2019   Essential hypertension 12/04/2019   Bilateral bunions 07/20/2018   Residual hemorrhoid tags 11/19/2014   Benign prostatic hyperplasia with weak urinary stream 11/19/2014    Consultants None   Imaging: DG Cholangiogram Operative Result Date: 05/03/2023 CLINICAL DATA:  [id number] Elective surgery [id number] Surgery: Lap Chole w/ Cholangiogram EXAM: INTRAOPERATIVE CHOLANGIOGRAM COMPARISON:  US Abdomen, 05/10/2023. FLUOROSCOPY: Exposure Index (as provided by the fluoroscopic device): 7.4 mGy Kerma FINDINGS: Limited oblique planar images of the RIGHT upper quadrant obtained C-arm. Images demonstrating laparoscopic instrumentation, cystic duct cannulation and antegrade cholangiogram. Trace extravasation at the gallbladder fossa. Free spillage of contrast into the duodenum. No biliary ductal dilation. No evidence of biliary filling defect is demonstrated. IMPRESSION: Fluoroscopic imaging for intraoperative cholangiogram. No filling defect of biliary ductal dilatation is demonstrated. For complete description of intra procedural findings, please see performing service dictation. Electronically Signed   By: Roanna Banning M.D.   On: 05/03/2023 09:10   US Abdomen Limited RUQ (LIVER/GB) Result Date: 05/02/2023 CLINICAL DATA:  Right upper quadrant pain EXAM: ULTRASOUND ABDOMEN LIMITED RIGHT UPPER QUADRANT COMPARISON:  None Available. FINDINGS: Gallbladder: Dilated gallbladder. Borderline wall thickening. Some  layering sludge but no stones. The patient does have pain when scanning of the gallbladder as per the sonographer. Common bile duct: Diameter: 3 mm Liver: No focal lesion identified. Within normal limits in parenchymal echogenicity. Portal vein is patent on color Doppler imaging with normal direction of blood flow towards the liver. Other: None. IMPRESSION: Dilated gallbladder with some sludge but no stones. There is a reported sonographic Murphy's sign. With the level of the dilatation in the pain when scanning of the gallbladder, please correlate with symptoms of acute cholecystitis or other acute gallbladder pathology. If needed further workup may be useful such as HIDA scan or MRI Electronically Signed   By: Karen Kays M.D.   On: 05/02/2023 10:43   DG Chest 2 View Result Date: 05/02/2023 CLINICAL DATA:  Chest pain and vomiting EXAM: CHEST - 2 VIEW COMPARISON:  Chest radiograph dated 05/22/2010 FINDINGS: Normal lung volumes. Bilateral lower lung patchy and interstitial opacities. No pleural effusion or pneumothorax. The heart size and mediastinal contours are within normal limits. No acute osseous abnormality. IMPRESSION: Bilateral lower lung patchy and interstitial opacities, which may represent atelectasis, pulmonary edema, or atypical infection. Electronically Signed   By: Agustin Cree M.D.   On: 05/02/2023 08:19    Procedures Dr. Manus Rudd (05/03/23) - Laparoscopic Cholecystectomy with [facility]   Hospital Course:  62 y/o M who presented to [facility] with abdominal pain.  Workup showed cholecystitis.  Patient was admitted and underwent procedure listed above.  Tolerated procedure well and was transferred to the floor. IOC negative for choledocholithiasis. Diet was advanced as tolerated.  On POD#0, the patient was voiding well, tolerating diet, ambulating well, pain well controlled, vital signs stable, incisions c/d/i and felt stable for discharge home.  Patient will follow up in our office as below and  knows to call with questions or concerns.    I have personally reviewed the  patients medication history on the [place] controlled substance database.  Physical Exam: General:  Alert, NAD, pleasant, comfortable Abd:  Soft, ND, mild tenderness, incisions C/D/I covered with tegaderm.   Allergies as of 05/03/2023   No Known Allergies      Medication List     TAKE these medications    acetaminophen 500 MG tablet Commonly known as: TYLENOL Take 2 tablets (1,000 mg total) by mouth every 6 (six) hours.   diphenhydrAMINE 25 MG tablet Commonly known as: BENADRYL Take 25 mg by mouth every 6 (six) hours as needed for sleep.   fluticasone 50 MCG/ACT nasal spray Commonly known as: FLONASE Place daily into both nostrils.   ibuprofen 600 MG tablet Commonly known as: ADVIL Take 1 tablet (600 mg total) by mouth 3 (three) times daily.   levocetirizine 5 MG tablet Commonly known as: XYZAL Take 5 mg by mouth every evening.   losartan 50 MG tablet Commonly known as: Cozaar Take 1 tablet (50 mg total) by mouth daily.   Melatonin 10 MG Tabs Take 10 mg by mouth at bedtime as needed (sleep).   multivitamin tablet Take 1 tablet by mouth daily.   oxyCODONE 5 MG immediate release tablet Commonly known as: Oxy IR/ROXICODONE Take 1 tablet (5 mg total) by mouth every 6 (six) hours as needed for breakthrough pain (not relieved by tylenol or advil).   polyethylene glycol 17 g packet Commonly known as: MIRALAX / GLYCOLAX Take 17 g by mouth daily as needed for mild constipation.   propranolol 40 MG tablet Commonly known as: INDERAL Take 0.5 tablets (20 mg total) by mouth 2 (two) times daily.   tamsulosin 0.4 MG Caps capsule Commonly known as: FLOMAX Take 1 capsule (0.4 mg total) by mouth daily after supper.          Follow-up Information     Manus Rudd, MD. Go on 05/27/2023.   Specialty: General Surgery Why: Your appointment is 1/6 10:50am Arrive early to check in, fill out  paperwork, Designer, fashion/clothing ID and insurance information Contact information: [address] [place] [address] [place] [address] [phone]                 Signed: Hosie Spangle, [facility] 05/03/2023, 2:06 PM

## 2023-05-03 NOTE — Anesthesia Procedure Notes (Signed)
Procedure Name: Intubation Date/Time: 05/03/2023 7:35 AM  Performed by: Waynard Edwards, CRNAPre-anesthesia Checklist: Patient identified, Emergency Drugs available, Suction available and Patient being monitored Patient Re-evaluated:Patient Re-evaluated prior to induction Oxygen Delivery Method: Circle system utilized Preoxygenation: Pre-oxygenation with 100% oxygen Induction Type: IV induction Ventilation: Mask ventilation without difficulty Laryngoscope Size: Miller and 2 Grade View: Grade II Tube type: Oral Tube size: 7.5 mm Number of attempts: 1 Airway Equipment and Method: Stylet Placement Confirmation: ETT inserted through vocal cords under direct vision, positive ETCO2 and breath sounds checked- equal and bilateral Secured at: 23 cm Tube secured with: Tape Dental Injury: Teeth and Oropharynx as per pre-operative assessment

## 2023-05-03 NOTE — Anesthesia Preprocedure Evaluation (Signed)
Anesthesia Evaluation  Patient identified by MRN, date of birth, ID band Patient awake    Reviewed: Allergy & Precautions, H&P , NPO status , Patient's Chart, lab work & pertinent test results  Airway Mallampati: II   Neck ROM: full    Dental   Pulmonary neg pulmonary ROS   breath sounds clear to auscultation       Cardiovascular hypertension,  Rhythm:regular Rate:Normal     Neuro/Psych   Anxiety        GI/Hepatic   Endo/Other    Renal/GU      Musculoskeletal  (+) Arthritis ,    Abdominal   Peds  Hematology   Anesthesia Other Findings   Reproductive/Obstetrics                             Anesthesia Physical Anesthesia Plan  ASA: 2  Anesthesia Plan: General   Post-op Pain Management:    Induction: Intravenous  PONV Risk Score and Plan: 2 and Ondansetron, Dexamethasone, Midazolam and Treatment may vary due to age or medical condition  Airway Management Planned: Oral ETT  Additional Equipment:   Intra-op Plan:   Post-operative Plan: Extubation in OR  Informed Consent: I have reviewed the patients History and Physical, chart, labs and discussed the procedure including the risks, benefits and alternatives for the proposed anesthesia with the patient or authorized representative who has indicated his/her understanding and acceptance.     Dental advisory given  Plan Discussed with: CRNA, Anesthesiologist and Surgeon  Anesthesia Plan Comments:        Anesthesia Quick Evaluation

## 2023-05-04 ENCOUNTER — Encounter (HOSPITAL_COMMUNITY): Payer: Self-pay | Admitting: Surgery

## 2023-05-06 LAB — SURGICAL PATHOLOGY

## 2023-06-24 ENCOUNTER — Encounter: Payer: Self-pay | Admitting: Family Medicine

## 2023-06-24 ENCOUNTER — Telehealth: Payer: Self-pay | Admitting: Family Medicine

## 2023-06-24 NOTE — Telephone Encounter (Signed)
Pt due for rpt PSA given elevated on CPE 12/2022.  Plz offer to schedule this- may be non-fasting.

## 2023-06-25 NOTE — Telephone Encounter (Signed)
Called patient set up lab appointment.will call if any questions.

## 2023-06-28 ENCOUNTER — Other Ambulatory Visit (INDEPENDENT_AMBULATORY_CARE_PROVIDER_SITE_OTHER): Payer: No Typology Code available for payment source

## 2023-06-28 DIAGNOSIS — Z8249 Family history of ischemic heart disease and other diseases of the circulatory system: Secondary | ICD-10-CM

## 2023-06-28 DIAGNOSIS — N401 Enlarged prostate with lower urinary tract symptoms: Secondary | ICD-10-CM

## 2023-06-28 DIAGNOSIS — R972 Elevated prostate specific antigen [PSA]: Secondary | ICD-10-CM

## 2023-07-04 LAB — PSA, TOTAL WITH REFLEX TO PSA, FREE: PSA, Total: 4.4 ng/mL — ABNORMAL HIGH (ref <=4.0)

## 2023-07-04 LAB — REFLEX PSA, FREE
PSA, % Free: 18 % — ABNORMAL LOW (ref >25)
PSA, Free: 0.8 ng/mL

## 2023-07-04 LAB — LIPOPROTEIN A (LPA): Lipoprotein (a): <10 nmol/L (ref <75)

## 2023-07-11 ENCOUNTER — Encounter: Payer: Self-pay | Admitting: Family Medicine

## 2023-08-02 ENCOUNTER — Inpatient Hospital Stay (HOSPITAL_COMMUNITY)
Admission: EM | Admit: 2023-08-02 | Discharge: 2023-08-06 | DRG: 948 | Disposition: A | Discharge status: Home / Self Care | Attending: Internal Medicine | Admitting: Internal Medicine

## 2023-08-02 ENCOUNTER — Telehealth: Admitting: Nurse Practitioner

## 2023-08-02 ENCOUNTER — Encounter (HOSPITAL_COMMUNITY): Payer: Self-pay | Admitting: *Deleted

## 2023-08-02 ENCOUNTER — Other Ambulatory Visit: Payer: Self-pay

## 2023-08-02 DIAGNOSIS — I959 Hypotension, unspecified: Secondary | ICD-10-CM

## 2023-08-02 DIAGNOSIS — R109 Unspecified abdominal pain: Secondary | ICD-10-CM

## 2023-08-02 DIAGNOSIS — E669 Obesity, unspecified: Secondary | ICD-10-CM | POA: Diagnosis present

## 2023-08-02 DIAGNOSIS — Z8719 Personal history of other diseases of the digestive system: Secondary | ICD-10-CM

## 2023-08-02 DIAGNOSIS — D72825 Bandemia: Secondary | ICD-10-CM | POA: Diagnosis not present

## 2023-08-02 DIAGNOSIS — R112 Nausea with vomiting, unspecified: Secondary | ICD-10-CM | POA: Diagnosis not present

## 2023-08-02 DIAGNOSIS — Z825 Family history of asthma and other chronic lower respiratory diseases: Secondary | ICD-10-CM

## 2023-08-02 DIAGNOSIS — R17 Unspecified jaundice: Secondary | ICD-10-CM

## 2023-08-02 DIAGNOSIS — R739 Hyperglycemia, unspecified: Secondary | ICD-10-CM | POA: Diagnosis not present

## 2023-08-02 DIAGNOSIS — N400 Enlarged prostate without lower urinary tract symptoms: Secondary | ICD-10-CM | POA: Insufficient documentation

## 2023-08-02 DIAGNOSIS — E860 Dehydration: Secondary | ICD-10-CM | POA: Diagnosis present

## 2023-08-02 DIAGNOSIS — I129 Hypertensive chronic kidney disease with stage 1 through stage 4 chronic kidney disease, or unspecified chronic kidney disease: Secondary | ICD-10-CM | POA: Diagnosis present

## 2023-08-02 DIAGNOSIS — N182 Chronic kidney disease, stage 2 (mild): Secondary | ICD-10-CM | POA: Diagnosis present

## 2023-08-02 DIAGNOSIS — K219 Gastro-esophageal reflux disease without esophagitis: Secondary | ICD-10-CM | POA: Insufficient documentation

## 2023-08-02 DIAGNOSIS — N179 Acute kidney failure, unspecified: Secondary | ICD-10-CM

## 2023-08-02 DIAGNOSIS — R1013 Epigastric pain: Secondary | ICD-10-CM | POA: Diagnosis present

## 2023-08-02 DIAGNOSIS — K5900 Constipation, unspecified: Secondary | ICD-10-CM

## 2023-08-02 DIAGNOSIS — Z6830 Body mass index (BMI) 30.0-30.9, adult: Secondary | ICD-10-CM

## 2023-08-02 DIAGNOSIS — N2889 Other specified disorders of kidney and ureter: Secondary | ICD-10-CM | POA: Clinically undetermined

## 2023-08-02 DIAGNOSIS — K649 Unspecified hemorrhoids: Secondary | ICD-10-CM | POA: Diagnosis present

## 2023-08-02 DIAGNOSIS — Z79899 Other long term (current) drug therapy: Secondary | ICD-10-CM

## 2023-08-02 DIAGNOSIS — I10 Essential (primary) hypertension: Secondary | ICD-10-CM | POA: Diagnosis present

## 2023-08-02 DIAGNOSIS — Z791 Long term (current) use of non-steroidal anti-inflammatories (NSAID): Secondary | ICD-10-CM

## 2023-08-02 DIAGNOSIS — Z823 Family history of stroke: Secondary | ICD-10-CM

## 2023-08-02 DIAGNOSIS — Z8249 Family history of ischemic heart disease and other diseases of the circulatory system: Secondary | ICD-10-CM

## 2023-08-02 DIAGNOSIS — Z9049 Acquired absence of other specified parts of digestive tract: Secondary | ICD-10-CM

## 2023-08-02 DIAGNOSIS — G4700 Insomnia, unspecified: Secondary | ICD-10-CM | POA: Insufficient documentation

## 2023-08-02 DIAGNOSIS — R7401 Elevation of levels of liver transaminase levels: Secondary | ICD-10-CM | POA: Diagnosis not present

## 2023-08-02 DIAGNOSIS — N189 Chronic kidney disease, unspecified: Secondary | ICD-10-CM

## 2023-08-02 DIAGNOSIS — Z91018 Allergy to other foods: Secondary | ICD-10-CM

## 2023-08-02 LAB — CBC WITH DIFFERENTIAL/PLATELET
Abs Immature Granulocytes: 0.1 10*3/uL — ABNORMAL HIGH (ref 0.00–0.07)
Basophils Absolute: 0 10*3/uL (ref 0.0–0.1)
Basophils Relative: 0 %
Eosinophils Absolute: 0 10*3/uL (ref 0.0–0.5)
Eosinophils Relative: 0 %
HCT: 46.3 % (ref 39.0–52.0)
Hemoglobin: 16 g/dL (ref 13.0–17.0)
Immature Granulocytes: 1 %
Lymphocytes Relative: 2 %
Lymphs Abs: 0.3 10*3/uL — ABNORMAL LOW (ref 0.7–4.0)
MCH: 31 pg (ref 26.0–34.0)
MCHC: 34.6 g/dL (ref 30.0–36.0)
MCV: 89.7 fL (ref 80.0–100.0)
Monocytes Absolute: 1.1 10*3/uL — ABNORMAL HIGH (ref 0.1–1.0)
Monocytes Relative: 6 %
Neutro Abs: 17.3 10*3/uL — ABNORMAL HIGH (ref 1.7–7.7)
Neutrophils Relative %: 91 %
Platelets: 257 10*3/uL (ref 150–400)
RBC: 5.16 MIL/uL (ref 4.22–5.81)
RDW: 13.3 % (ref 11.5–15.5)
WBC: 18.8 10*3/uL — ABNORMAL HIGH (ref 4.0–10.5)
nRBC: 0 % (ref 0.0–0.2)

## 2023-08-02 LAB — COMPREHENSIVE METABOLIC PANEL
ALT: 533 U/L — ABNORMAL HIGH (ref 0–44)
AST: 319 U/L — ABNORMAL HIGH (ref 15–41)
Albumin: 3.8 g/dL (ref 3.5–5.0)
Alkaline Phosphatase: 93 U/L (ref 38–126)
Anion gap: 14 (ref 5–15)
BUN: 10 mg/dL (ref 8–23)
CO2: 21 mmol/L — ABNORMAL LOW (ref 22–32)
Calcium: 9.2 mg/dL (ref 8.9–10.3)
Chloride: 104 mmol/L (ref 98–111)
Creatinine, Ser: 1.62 mg/dL — ABNORMAL HIGH (ref 0.61–1.24)
GFR, Estimated: 47 mL/min — ABNORMAL LOW (ref >60)
Glucose, Bld: 147 mg/dL — ABNORMAL HIGH (ref 70–99)
Potassium: 3.8 mmol/L (ref 3.5–5.1)
Sodium: 139 mmol/L (ref 135–145)
Total Bilirubin: 8.8 mg/dL — ABNORMAL HIGH (ref 0.0–1.2)
Total Protein: 6.7 g/dL (ref 6.5–8.1)

## 2023-08-02 LAB — URINALYSIS, ROUTINE W REFLEX MICROSCOPIC
Bacteria, UA: NONE SEEN
Bilirubin Urine: NEGATIVE
Glucose, UA: NEGATIVE mg/dL
Hgb urine dipstick: NEGATIVE
Ketones, ur: 5 mg/dL — AB
Leukocytes,Ua: NEGATIVE
Nitrite: NEGATIVE
Protein, ur: 30 mg/dL — AB
Specific Gravity, Urine: 1.018 (ref 1.005–1.030)
pH: 5 (ref 5.0–8.0)

## 2023-08-02 LAB — LIPASE, BLOOD: Lipase: 26 U/L (ref 11–51)

## 2023-08-02 MED ORDER — ONDANSETRON 4 MG PO TBDP: 4.0000 mg | ORAL_TABLET | Freq: Three times a day (TID) | ORAL | 0 refills | Status: DC | PRN

## 2023-08-02 MED ORDER — METOCLOPRAMIDE HCL 5 MG/ML IJ SOLN
10.0000 mg | INTRAMUSCULAR | Status: AC
  Administered 2023-08-03: 10 mg via INTRAVENOUS
  Filled 2023-08-02: qty 2

## 2023-08-02 MED ORDER — ONDANSETRON 4 MG PO TBDP: 4.0000 mg | ORAL_TABLET | Freq: Once | ORAL | Status: DC

## 2023-08-02 MED ORDER — SODIUM CHLORIDE 0.9 % IV BOLUS
1000.0000 mL | Freq: Once | INTRAVENOUS | Status: AC
  Administered 2023-08-03: 1000 mL via INTRAVENOUS

## 2023-08-02 MED ORDER — FENTANYL CITRATE PF 50 MCG/ML IJ SOSY
50.0000 ug | PREFILLED_SYRINGE | Freq: Once | INTRAMUSCULAR | Status: AC
  Administered 2023-08-03: 50 ug via INTRAVENOUS
  Filled 2023-08-02: qty 1

## 2023-08-02 NOTE — ED Provider Triage Note (Signed)
 Emergency Medicine Provider Triage Evaluation Note  ALEZANDER DIMAANO , a 63 y.o. male  was evaluated in triage.  Pt complains of abdominal pain, nausea, vomiting.  Reports symptoms began yesterday.  States he has not had a bowel movement since Wednesday.  States this is abnormal for him.  Endorsing abdominal distention.  Denies chest pain, shortness of breath, fevers.  Reports history of cholecystectomy.  Review of Systems  Positive:  Negative:   Physical Exam  BP 97/87 (BP Location: Right Arm)   Pulse (!) 116   Temp 97.8 F (36.6 C) (Oral)   Resp 18   Ht 5\' 10"  (1.778 m)   Wt 95.3 kg   SpO2 97%   BMI 30.15 kg/m  Gen:   Awake, no distress   Resp:  Normal effort  MSK:   Moves extremities without difficulty  Other:  Abdomen distended  Medical Decision Making  Medically screening exam initiated at 8:12 PM.  Appropriate orders placed.  Amador Cunas was informed that the remainder of the evaluation will be completed by another provider, this initial triage assessment does not replace that evaluation, and the importance of remaining in the ED until their evaluation is complete.     Al Decant, PA-C 08/02/23 2013

## 2023-08-02 NOTE — ED Triage Notes (Signed)
 Abd pain with n v for 2 days  no bm since Wednesday am

## 2023-08-02 NOTE — Progress Notes (Signed)
 E-Visit for Nausea and Vomiting   We are sorry that you are not feeling well. Here is how we plan to help!  Based on what you have shared with me it looks like you have a Virus that is irritating your GI tract.  Vomiting is the forceful emptying of a portion of the stomach's content through the mouth.  Although nausea and vomiting can make you feel miserable, it's important to remember that these are not diseases, but rather symptoms of an underlying illness.  When we treat short term symptoms, we always caution that any symptoms that persist should be fully evaluated in a medical office.  I have prescribed a medication that will help alleviate your symptoms and allow you to stay hydrated:  Zofran 4 mg 1 tablet every 8 hours as needed for nausea and vomiting  HOME CARE: Drink clear liquids.  This is very important! Dehydration (the lack of fluid) can lead to a serious complication.  Start off with 1 tablespoon every 5 minutes for 8 hours. You may begin eating bland foods after 8 hours without vomiting.  Start with saltine crackers, white bread, rice, mashed potatoes, applesauce. After 48 hours on a bland diet, you may resume a normal diet. Try to go to sleep.  Sleep often empties the stomach and relieves the need to vomit.  GET HELP RIGHT AWAY IF:  Your symptoms do not improve or worsen within 2 days after treatment. You have a fever for over 3 days. You cannot keep down fluids after trying the medication.  MAKE SURE YOU:  Understand these instructions. Will watch your condition. Will get help right away if you are not doing well or get worse.    Thank you for choosing an e-visit.  Your e-visit answers were reviewed by a board certified advanced clinical practitioner to complete your personal care plan. Depending upon the condition, your plan could have included both over the counter or prescription medications.  Please review your pharmacy choice. Make sure the pharmacy is open so  you can pick up prescription now. If there is a problem, you may contact your provider through [employer] and have the prescription routed to another pharmacy.  Your safety is important to [place]. If you have drug allergies check your prescription carefully.   For the next 24 hours you can use MyChart to ask questions about today's visit, request a non-urgent call back, or ask for a work or school excuse. You will get an email in the next two days asking about your experience. I hope that your e-visit has been valuable and will speed your recovery.   I spent approximately 5 minutes reviewing the patient's history, current symptoms and coordinating their care today.

## 2023-08-02 NOTE — ED Provider Notes (Signed)
 [facility] EMERGENCY DEPARTMENT AT [facility] Provider Note   CSN: [id number] Arrival date & time: 08/02/23  1945     History  Chief Complaint  Patient presents with   Abdominal Pain    Joel Morales is a 63 y.o. male.  The history is provided by the patient and medical records.  Abdominal Pain  63 year old male with history of BPH, hypertension, anxiety, presenting to the ED with abdominal pain.  Began yesterday and has been worsening throughout the day today.  Joel Morales has had a lot of trouble with nausea and vomiting, not really able to hold much down today.  Is also not really had a bowel movement in about 2 days which is abnormal for Joel Morales.  Denies any diarrhea.  Wife states his color seems to have changed, seemed "green" yesterday but more "yellow appearing" today.  Has had prior cholecystectomy December 2024.  Home Medications Prior to Admission medications   Medication Sig Start Date End Date Taking? Authorizing Provider  acetaminophen (TYLENOL) 500 MG tablet Take 2 tablets (1,000 mg total) by mouth every 6 (six) hours. 05/03/23   Adam Phenix, PA-C  diphenhydrAMINE (BENADRYL) 25 MG tablet Take 25 mg by mouth every 6 (six) hours as needed for sleep.    [provider]  fluticasone (FLONASE) 50 MCG/ACT nasal spray Place daily into both nostrils.    [provider]  ibuprofen (ADVIL) 600 MG tablet Take 1 tablet (600 mg total) by mouth 3 (three) times daily. 05/03/23   Adam Phenix, PA-C  levocetirizine (XYZAL) 5 MG tablet Take 5 mg by mouth every evening.    [provider]  losartan (COZAAR) 50 MG tablet Take 1 tablet (50 mg total) by mouth daily. 01/02/23   Eustaquio Boyden, MD  Melatonin 10 MG TABS Take 10 mg by mouth at bedtime as needed (sleep).    [provider]  Multiple Vitamin (MULTIVITAMIN) tablet Take 1 tablet by mouth daily.    [provider]  ondansetron (ZOFRAN-ODT) 4 MG disintegrating tablet  Take 1 tablet (4 mg total) by mouth every 8 (eight) hours as needed. 08/02/23   Viviano Simas, FNP  oxyCODONE (OXY IR/ROXICODONE) 5 MG immediate release tablet Take 1 tablet (5 mg total) by mouth every 6 (six) hours as needed for breakthrough pain (not relieved by tylenol or advil). 05/03/23   Adam Phenix, PA-C  polyethylene glycol (MIRALAX / GLYCOLAX) 17 g packet Take 17 g by mouth daily as needed for mild constipation. 05/03/23   Adam Phenix, PA-C  propranolol (INDERAL) 40 MG tablet Take 0.5 tablets (20 mg total) by mouth 2 (two) times daily. 01/02/23   Eustaquio Boyden, MD  tamsulosin (FLOMAX) 0.4 MG CAPS capsule Take 1 capsule (0.4 mg total) by mouth daily after supper. 01/02/23   Eustaquio Boyden, MD      Allergies    Patient has no known allergies.    Review of Systems   Review of Systems  Gastrointestinal:  Positive for abdominal pain.  All other systems reviewed and are negative.   Physical Exam Updated Vital Signs BP 102/63 (BP Location: Left Arm)   Pulse (!) 107   Temp 99.2 F (37.3 C)   Resp (!) 24   Ht 5\' 10"  (1.778 m)   Wt 95.3 kg   SpO2 97%   BMI 30.15 kg/m   Physical Exam Vitals and nursing note reviewed.  Constitutional:      Appearance: Joel Morales is well-developed.  Comments: Seems uncomfortable, slightly jaundiced  HENT:     Head: Normocephalic and atraumatic.  Eyes:     Conjunctiva/sclera: Conjunctivae normal.     Pupils: Pupils are equal, round, and reactive to light.  Cardiovascular:     Rate and Rhythm: Normal rate and regular rhythm.     Heart sounds: Normal heart sounds.  Pulmonary:     Effort: Pulmonary effort is normal.     Breath sounds: Normal breath sounds.  Abdominal:     General: Bowel sounds are decreased. There is distension.     Palpations: Abdomen is soft.  Musculoskeletal:        General: Normal range of motion.     Cervical back: Normal range of motion.  Skin:    General: Skin is warm and dry.  Neurological:      Mental Status: Joel Morales is alert and oriented to person, place, and time.     ED Results / Procedures / Treatments   Labs (all labs ordered are listed, but only abnormal results are displayed) Labs Reviewed  CBC WITH DIFFERENTIAL/PLATELET - Abnormal; Notable for the following components:      Result Value   WBC 18.8 (*)    Neutro Abs 17.3 (*)    Lymphs Abs 0.3 (*)    Monocytes Absolute 1.1 (*)    Abs Immature Granulocytes 0.10 (*)    All other components within normal limits  COMPREHENSIVE METABOLIC PANEL - Abnormal; Notable for the following components:   CO2 21 (*)    Glucose, Bld 147 (*)    Creatinine, Ser 1.62 (*)    AST 319 (*)    ALT 533 (*)    Total Bilirubin 8.8 (*)    GFR, Estimated 47 (*)    All other components within normal limits  URINALYSIS, ROUTINE W REFLEX MICROSCOPIC - Abnormal; Notable for the following components:   Color, Urine AMBER (*)    APPearance HAZY (*)    Ketones, ur 5 (*)    Protein, ur 30 (*)    All other components within normal limits  ACETAMINOPHEN LEVEL - Abnormal; Notable for the following components:   Acetaminophen (Tylenol), Serum <10 (*)    All other components within normal limits  LIPASE, BLOOD  LIPASE, BLOOD  HEPATITIS PANEL, ACUTE  ANTI-SMOOTH MUSCLE ANTIBODY, IGG  COMPREHENSIVE METABOLIC PANEL  CBC  APTT  PROTIME-INR  HEPATITIS PANEL, ACUTE    EKG None  Radiology CT ABDOMEN PELVIS W CONTRAST Result Date: 08/03/2023 CLINICAL DATA:  Abdominal pain, nausea, vomiting. EXAM: CT ABDOMEN AND PELVIS WITH CONTRAST TECHNIQUE: Multidetector CT imaging of the abdomen and pelvis was performed using the standard protocol following bolus administration of intravenous contrast. RADIATION DOSE REDUCTION: This exam was performed according to the departmental dose-optimization program which includes automated exposure control, adjustment of the mA and/or kV according to patient size and/or use of iterative reconstruction technique. CONTRAST:  75mL  OMNIPAQUE IOHEXOL 350 MG/ML SOLN COMPARISON:  None Available. FINDINGS: Lower chest: No acute abnormality Hepatobiliary: No focal liver abnormality is seen. Status post cholecystectomy. No biliary dilatation. Pancreas: No focal abnormality or ductal dilatation. Spleen: No focal abnormality.  Normal size. Adrenals/Urinary Tract: No adrenal abnormality. No focal renal abnormality. No stones or hydronephrosis. Urinary bladder is unremarkable. Stomach/Bowel: Normal appendix. Stomach, large and small bowel grossly unremarkable. No bowel obstruction or inflammatory process. Vascular/Lymphatic: No evidence of aneurysm or adenopathy. Reproductive: Prostate enlargement Other: No free fluid or free air. Musculoskeletal: No acute bony abnormality. IMPRESSION: No acute findings  in the abdomen or pelvis. Electronically Signed   By: Charlett Nose M.D.   On: 08/03/2023 00:57    Procedures Procedures    Medications Ordered in ED Medications  ondansetron (ZOFRAN-ODT) disintegrating tablet 4 mg (0 mg Oral Hold 08/03/23 0016)  sodium chloride 0.9 % bolus 1,000 mL (1,000 mLs Intravenous New Bag/Given 08/03/23 0009)  metoCLOPramide (REGLAN) injection 10 mg (10 mg Intravenous Given 08/03/23 0009)  fentaNYL (SUBLIMAZE) injection 50 mcg (50 mcg Intravenous Given 08/03/23 0008)    ED Course/ Medical Decision Making/ A&P                                 Medical Decision Making Amount and/or Complexity of Data Reviewed Labs: ordered. Radiology: ordered and independent interpretation performed. ECG/medicine tests: ordered and independent interpretation performed.  Risk Prescription drug management. Decision regarding hospitalization.   63 year old male presenting to the ED with abdominal pain.  Has had a lot of associated nausea and vomiting, little bowel movement.  States Joel Morales feels distended and wife has noticed the skin appearing more yellow in color.  Joel Morales is afebrile and nontoxic but does appear uncomfortable.  Joel Morales  does appear slightly jaundiced on exam.  His abdomen is distended with decreased bowel sounds.   Labs as above--leukocytosis to 18,000.  Does have transaminitis and elevated bilirubin to 8.  CT without identifiable cause of this.  Joel Morales is status post cholecystectomy.  Joel Morales denies any heavy alcohol use, no heavy Tylenol use.  Joel Morales has no history of IV drug use.  No recent travel in the last 6 months (went to [place] Sept 2024 but no contaminated water/food intake).  Unclear etiology of his transaminitis.  I feel Joel Morales will require admission.  Joel Morales is followed by GI, Dr. Elnoria Howard.  Will message his team for a.m. consultation.  Hepatitis panel has been sent.  Discussed with Dr. Kateri Mc admit for ongoing care.  Secure chat message sent to Dr. Mann/Dr. Elnoria Howard for a.m. consultation.  Final Clinical Impression(s) / ED Diagnoses Final diagnoses:  Transaminitis  Elevated bilirubin    Rx / DC Orders ED Discharge Orders     None         Garlon Hatchet, PA-C 08/03/23 0213    Coral Spikes, DO 08/03/23 [phone]

## 2023-08-03 ENCOUNTER — Encounter (HOSPITAL_COMMUNITY): Payer: Self-pay | Admitting: Internal Medicine

## 2023-08-03 ENCOUNTER — Inpatient Hospital Stay (HOSPITAL_COMMUNITY)

## 2023-08-03 ENCOUNTER — Emergency Department (HOSPITAL_COMMUNITY)

## 2023-08-03 DIAGNOSIS — E860 Dehydration: Secondary | ICD-10-CM | POA: Diagnosis present

## 2023-08-03 DIAGNOSIS — I959 Hypotension, unspecified: Secondary | ICD-10-CM | POA: Diagnosis present

## 2023-08-03 DIAGNOSIS — Z91018 Allergy to other foods: Secondary | ICD-10-CM | POA: Diagnosis not present

## 2023-08-03 DIAGNOSIS — R7401 Elevation of levels of liver transaminase levels: Principal | ICD-10-CM

## 2023-08-03 DIAGNOSIS — K219 Gastro-esophageal reflux disease without esophagitis: Secondary | ICD-10-CM | POA: Diagnosis present

## 2023-08-03 DIAGNOSIS — E861 Hypovolemia: Secondary | ICD-10-CM

## 2023-08-03 DIAGNOSIS — Z9049 Acquired absence of other specified parts of digestive tract: Secondary | ICD-10-CM

## 2023-08-03 DIAGNOSIS — R1013 Epigastric pain: Secondary | ICD-10-CM | POA: Diagnosis present

## 2023-08-03 DIAGNOSIS — N179 Acute kidney failure, unspecified: Secondary | ICD-10-CM | POA: Diagnosis present

## 2023-08-03 DIAGNOSIS — R109 Unspecified abdominal pain: Secondary | ICD-10-CM

## 2023-08-03 DIAGNOSIS — R112 Nausea with vomiting, unspecified: Secondary | ICD-10-CM | POA: Diagnosis present

## 2023-08-03 DIAGNOSIS — N2889 Other specified disorders of kidney and ureter: Secondary | ICD-10-CM | POA: Diagnosis present

## 2023-08-03 DIAGNOSIS — K5900 Constipation, unspecified: Secondary | ICD-10-CM | POA: Diagnosis present

## 2023-08-03 DIAGNOSIS — G4700 Insomnia, unspecified: Secondary | ICD-10-CM | POA: Diagnosis present

## 2023-08-03 DIAGNOSIS — D72825 Bandemia: Secondary | ICD-10-CM | POA: Diagnosis not present

## 2023-08-03 DIAGNOSIS — I10 Essential (primary) hypertension: Secondary | ICD-10-CM

## 2023-08-03 DIAGNOSIS — N400 Enlarged prostate without lower urinary tract symptoms: Secondary | ICD-10-CM | POA: Insufficient documentation

## 2023-08-03 DIAGNOSIS — F5101 Primary insomnia: Secondary | ICD-10-CM

## 2023-08-03 DIAGNOSIS — N182 Chronic kidney disease, stage 2 (mild): Secondary | ICD-10-CM | POA: Diagnosis present

## 2023-08-03 DIAGNOSIS — Z791 Long term (current) use of non-steroidal anti-inflammatories (NSAID): Secondary | ICD-10-CM | POA: Diagnosis not present

## 2023-08-03 DIAGNOSIS — R739 Hyperglycemia, unspecified: Secondary | ICD-10-CM | POA: Diagnosis not present

## 2023-08-03 DIAGNOSIS — Z8719 Personal history of other diseases of the digestive system: Secondary | ICD-10-CM

## 2023-08-03 DIAGNOSIS — Z8249 Family history of ischemic heart disease and other diseases of the circulatory system: Secondary | ICD-10-CM | POA: Diagnosis not present

## 2023-08-03 DIAGNOSIS — K649 Unspecified hemorrhoids: Secondary | ICD-10-CM | POA: Diagnosis present

## 2023-08-03 DIAGNOSIS — Z79899 Other long term (current) drug therapy: Secondary | ICD-10-CM | POA: Diagnosis not present

## 2023-08-03 DIAGNOSIS — Z6830 Body mass index (BMI) 30.0-30.9, adult: Secondary | ICD-10-CM | POA: Diagnosis not present

## 2023-08-03 DIAGNOSIS — E669 Obesity, unspecified: Secondary | ICD-10-CM | POA: Diagnosis present

## 2023-08-03 DIAGNOSIS — I129 Hypertensive chronic kidney disease with stage 1 through stage 4 chronic kidney disease, or unspecified chronic kidney disease: Secondary | ICD-10-CM | POA: Diagnosis present

## 2023-08-03 DIAGNOSIS — N189 Chronic kidney disease, unspecified: Secondary | ICD-10-CM

## 2023-08-03 LAB — HEMOGLOBIN A1C
Hgb A1c MFr Bld: 4.8 % (ref 4.8–5.6)
Mean Plasma Glucose: 91.06 mg/dL

## 2023-08-03 LAB — COMPREHENSIVE METABOLIC PANEL
ALT: 337 U/L — ABNORMAL HIGH (ref 0–44)
AST: 174 U/L — ABNORMAL HIGH (ref 15–41)
Albumin: 3 g/dL — ABNORMAL LOW (ref 3.5–5.0)
Alkaline Phosphatase: 77 U/L (ref 38–126)
Anion gap: 6 (ref 5–15)
BUN: 16 mg/dL (ref 8–23)
CO2: 24 mmol/L (ref 22–32)
Calcium: 8.2 mg/dL — ABNORMAL LOW (ref 8.9–10.3)
Chloride: 105 mmol/L (ref 98–111)
Creatinine, Ser: 2.17 mg/dL — ABNORMAL HIGH (ref 0.61–1.24)
GFR, Estimated: 33 mL/min — ABNORMAL LOW (ref >60)
Glucose, Bld: 146 mg/dL — ABNORMAL HIGH (ref 70–99)
Potassium: 3.8 mmol/L (ref 3.5–5.1)
Sodium: 135 mmol/L (ref 135–145)
Total Bilirubin: 7.4 mg/dL — ABNORMAL HIGH (ref 0.0–1.2)
Total Protein: 5.4 g/dL — ABNORMAL LOW (ref 6.5–8.1)

## 2023-08-03 LAB — BRAIN NATRIURETIC PEPTIDE: B Natriuretic Peptide: 151.2 pg/mL — ABNORMAL HIGH (ref 0.0–100.0)

## 2023-08-03 LAB — BILIRUBIN, FRACTIONATED(TOT/DIR/INDIR)
Bilirubin, Direct: 4.6 mg/dL — ABNORMAL HIGH (ref 0.0–0.2)
Indirect Bilirubin: 2.7 mg/dL — ABNORMAL HIGH (ref 0.3–0.9)
Total Bilirubin: 7.3 mg/dL — ABNORMAL HIGH (ref 0.0–1.2)

## 2023-08-03 LAB — TSH: TSH: 0.378 u[IU]/mL (ref 0.350–4.500)

## 2023-08-03 LAB — HEPATITIS PANEL, ACUTE
HCV Ab: NONREACTIVE
HCV Ab: NONREACTIVE
Hep A IgM: NONREACTIVE
Hep A IgM: NONREACTIVE
Hep B C IgM: NONREACTIVE
Hep B C IgM: NONREACTIVE
Hepatitis B Surface Ag: NONREACTIVE
Hepatitis B Surface Ag: NONREACTIVE

## 2023-08-03 LAB — CBC
HCT: 38.9 % — ABNORMAL LOW (ref 39.0–52.0)
Hemoglobin: 13.4 g/dL (ref 13.0–17.0)
MCH: 30.9 pg (ref 26.0–34.0)
MCHC: 34.4 g/dL (ref 30.0–36.0)
MCV: 89.6 fL (ref 80.0–100.0)
Platelets: 192 10*3/uL (ref 150–400)
RBC: 4.34 MIL/uL (ref 4.22–5.81)
RDW: 13.6 % (ref 11.5–15.5)
WBC: 21 10*3/uL — ABNORMAL HIGH (ref 4.0–10.5)
nRBC: 0 % (ref 0.0–0.2)

## 2023-08-03 LAB — TROPONIN I (HIGH SENSITIVITY)
Troponin I (High Sensitivity): 10 ng/L (ref <18)
Troponin I (High Sensitivity): 9 ng/L (ref <18)

## 2023-08-03 LAB — LIPASE, BLOOD: Lipase: 26 U/L (ref 11–51)

## 2023-08-03 LAB — PROTIME-INR
INR: 1.5 — ABNORMAL HIGH (ref 0.8–1.2)
Prothrombin Time: 18.7 s — ABNORMAL HIGH (ref 11.4–15.2)

## 2023-08-03 LAB — CORTISOL-AM, BLOOD: Cortisol - AM: 29.4 ug/dL — ABNORMAL HIGH (ref 6.7–22.6)

## 2023-08-03 LAB — LACTIC ACID, PLASMA
Lactic Acid, Venous: 1.4 mmol/L (ref 0.5–1.9)
Lactic Acid, Venous: 0.9 mmol/L (ref 0.5–1.9)

## 2023-08-03 LAB — APTT: aPTT: 29 s (ref 24–36)

## 2023-08-03 LAB — HIV ANTIBODY (ROUTINE TESTING W REFLEX): HIV Screen 4th Generation wRfx: NONREACTIVE

## 2023-08-03 LAB — ACETAMINOPHEN LEVEL: Acetaminophen (Tylenol), Serum: <10 ug/mL — ABNORMAL LOW (ref 10–30)

## 2023-08-03 LAB — PROCALCITONIN: Procalcitonin: 14.34 ng/mL

## 2023-08-03 MED ORDER — SENNOSIDES-DOCUSATE SODIUM 8.6-50 MG PO TABS
1.0000 | ORAL_TABLET | Freq: Two times a day (BID) | ORAL | Status: DC
  Administered 2023-08-03 – 2023-08-06 (×6): 1 via ORAL
  Filled 2023-08-03 (×7): qty 1

## 2023-08-03 MED ORDER — SODIUM CHLORIDE 0.9 % IV SOLN
2.0000 g | INTRAVENOUS | Status: DC
  Administered 2023-08-03 – 2023-08-06 (×4): 2 g via INTRAVENOUS
  Filled 2023-08-03 (×4): qty 20

## 2023-08-03 MED ORDER — SALINE SPRAY 0.65 % NA SOLN
1.0000 | NASAL | Status: DC | PRN
Start: 2023-08-03 — End: 2023-08-06
  Filled 2023-08-03: qty 44

## 2023-08-03 MED ORDER — SODIUM CHLORIDE 0.9% FLUSH
3.0000 mL | Freq: Two times a day (BID) | INTRAVENOUS | Status: DC
  Administered 2023-08-03 – 2023-08-06 (×8): 3 mL via INTRAVENOUS

## 2023-08-03 MED ORDER — MELATONIN 5 MG PO TABS: 10.0000 mg | ORAL_TABLET | Freq: Every evening | ORAL | Status: DC | PRN

## 2023-08-03 MED ORDER — GADOBUTROL 1 MMOL/ML IV SOLN
9.5000 mL | Freq: Once | INTRAVENOUS | Status: AC | PRN
  Administered 2023-08-03: 9.5 mL via INTRAVENOUS

## 2023-08-03 MED ORDER — HYDROCORTISONE ACETATE 25 MG RE SUPP: 25.0000 mg | Freq: Two times a day (BID) | RECTAL | Status: DC | PRN

## 2023-08-03 MED ORDER — PANTOPRAZOLE SODIUM 20 MG PO TBEC
20.0000 mg | DELAYED_RELEASE_TABLET | Freq: Every day | ORAL | Status: DC
  Administered 2023-08-04 – 2023-08-06 (×3): 20 mg via ORAL
  Filled 2023-08-03 (×4): qty 1

## 2023-08-03 MED ORDER — METRONIDAZOLE 500 MG/100ML IV SOLN
500.0000 mg | Freq: Two times a day (BID) | INTRAVENOUS | Status: DC
  Administered 2023-08-03 – 2023-08-05 (×6): 500 mg via INTRAVENOUS
  Filled 2023-08-03 (×6): qty 100

## 2023-08-03 MED ORDER — POLYETHYLENE GLYCOL 3350 17 G PO PACK
17.0000 g | PACK | Freq: Every day | ORAL | Status: DC
  Administered 2023-08-03 – 2023-08-06 (×2): 17 g via ORAL
  Filled 2023-08-03 (×3): qty 1

## 2023-08-03 MED ORDER — LACTATED RINGERS IV SOLN: INTRAVENOUS | Status: AC

## 2023-08-03 MED ORDER — LACTATED RINGERS IV BOLUS
1000.0000 mL | Freq: Once | INTRAVENOUS | Status: AC
  Administered 2023-08-03: 1000 mL via INTRAVENOUS

## 2023-08-03 MED ORDER — SODIUM CHLORIDE 0.9 % IV SOLN: 250.0000 mL | INTRAVENOUS | Status: AC | PRN

## 2023-08-03 MED ORDER — ONDANSETRON HCL 4 MG/2ML IJ SOLN
4.0000 mg | Freq: Four times a day (QID) | INTRAMUSCULAR | Status: DC | PRN
  Administered 2023-08-03 (×2): 4 mg via INTRAVENOUS
  Filled 2023-08-03 (×2): qty 2

## 2023-08-03 MED ORDER — HEPARIN SODIUM (PORCINE) 5000 UNIT/ML IJ SOLN
5000.0000 [IU] | Freq: Three times a day (TID) | INTRAMUSCULAR | Status: DC
  Administered 2023-08-03 – 2023-08-06 (×7): 5000 [IU] via SUBCUTANEOUS
  Filled 2023-08-03 (×8): qty 1

## 2023-08-03 MED ORDER — IOHEXOL 350 MG/ML SOLN
75.0000 mL | Freq: Once | INTRAVENOUS | Status: AC | PRN
  Administered 2023-08-03: 75 mL via INTRAVENOUS

## 2023-08-03 MED ORDER — SODIUM CHLORIDE 0.9% FLUSH: 3.0000 mL | INTRAVENOUS | Status: DC | PRN

## 2023-08-03 MED ORDER — PROPRANOLOL HCL 10 MG PO TABS
20.0000 mg | ORAL_TABLET | Freq: Two times a day (BID) | ORAL | Status: DC
  Filled 2023-08-03: qty 2

## 2023-08-03 MED ORDER — ONDANSETRON HCL 4 MG PO TABS: 4.0000 mg | ORAL_TABLET | Freq: Four times a day (QID) | ORAL | Status: DC | PRN

## 2023-08-03 NOTE — Progress Notes (Signed)
 PROGRESS NOTE  Joel Morales WUJ:[id number] DOB: [birth year]   PCP: Eustaquio Boyden, MD  Patient is from: Home.  Lives with wife.  DOA: 08/02/2023 LOS: 0  Chief complaints Chief Complaint  Patient presents with   Abdominal Pain     Brief Narrative / Interim history: 63 year old M with PMH of cholecystitis s/p cholecystectomy in 04/2023, HTN, GERD, BPH, hemorrhoid and insomnia presenting with abdominal pain with associated nausea, vomiting and poor oral tolerance for 1 day, and admitted with hyperbilirubinemia/transaminitis/jaundice.  Is drinking more than one beer a day or excess Tylenol use.  No fever, leukocytosis or URI symptoms.  No lymphadenopathy.  Reportedly had a normal colonoscopy about a year ago.  Followed by Dr. Elnoria Howard.  In ED, slightly hypotensive to 87/66.  HR 116.  RR 27.  WBC 18.8 with left shift.  AST 319.  ALT 553.  Total bili 8.8. Cr 1.62 (baseline 1.1-1.2).  Tylenol and EtOH level negative.  Lipase normal.  CT abdomen and pelvis without acute finding.  Acute hepatitis panel negative.   Subjective: Seen and examined earlier this morning.  No major events overnight of this morning.  No complaints.  He denies nausea, vomiting or pain.  Urine still looks dark.  Denies heavy drinking, excessive Tylenol use or recent illness.  Patient's wife at bedside.  Objective: Vitals:   08/03/23 0015 08/03/23 0305 08/03/23 0504 08/03/23 0803  BP: 104/65 (!) 91/58 (!) 97/54 (!) 99/52  Pulse: 93 86 84 78  Resp: (!) 27 20 18 16   Temp:  98.1 F (36.7 C) 98.2 F (36.8 C) 98.3 F (36.8 C)  TempSrc:  Oral Oral Oral  SpO2: 96% 97% 98% 97%  Weight:      Height:        Examination:  GENERAL: No apparent distress.  Nontoxic. HEENT: MMM.  Vision and hearing grossly intact.  Sclerae icteric. NECK: Supple.  No apparent JVD.  RESP:  No IWOB.  Fair aeration bilaterally. CVS:  RRR. Heart sounds normal.  ABD/GI/GU: BS+. Abd soft, NTND.  MSK/EXT:  Moves extremities. No apparent  deformity. No edema.  SKIN: Some jaundice in his face. NEURO: Awake, alert and oriented appropriately.  No apparent focal neuro deficit. PSYCH: Calm. Normal affect.   Consultants:  [facility]  Procedures: None  Microbiology summarized: None  Assessment and plan: Obstructive jaundice/transaminitis/hyperbilirubinemia/jaundice: Patient with cholecystectomy in 12/24.  Hyperbilirubinemia with direct predominance suggesting obstructive etiology.  Patient denies drinking more than 1 beer a day or excess alcohol.  No recent illness.  No new medication.  Acute hepatitis panel negative.  Lipase normal.  Liver enzymes improving but worsening leukocytosis.  GI symptoms resolved. -[facility] consulted -Continue monitoring LFT -Start ceftriaxone and Flagyl. -MRCP, blood culture, lactic acid and procalcitonin -Avoid hepatotoxins -Follow ASMA  Nausea, vomiting, abdominal pain: Resolved. -Antiemetics, PPI and analgesics as needed -Follow GI rec    AKI: Baseline Cr ~1.1.  Renal function slightly worse likely due to hypotension.  Also received contrast for CT. Recent Labs    12/24/22 0845 05/02/23 0750 05/03/23 0557 08/02/23 2016 08/03/23 0643  BUN 12 11 8 10 16   CREATININE 1.11 1.18 1.14 1.62* 2.17*  -Continue IV fluid -Continue monitoring -Strict intake and output -Avoid nephrotoxins.   Hypotension-improving.  Cortisol elevated as expected.  TSH normal Essential hypertension -Continue IV fluid -Antibiotics as above -Check blood culture   Constipation -Bowel regimen   BPH -Continue holding Flomax in the setting of hypotension -Strict intake and output   GERD -Continue  Protonix   Insomnia -Continue melatonin   History of hemorrhoid -Anusol as needed  Hyperglycemia: No history of diabetes. -Check A1c  Leukocytosis with bandemia -Rule out infectious source -Antibiotics as above -Continue monitoring  Obesity Body mass index is 30.15 kg/m.          DVT  prophylaxis:  heparin injection 5,000 Units Start: 08/03/23 0600 SCDs Start: 08/03/23 0135 Place TED hose Start: 08/03/23 0135  Code Status: Full code Family Communication: Updated patient's wife at bedside Level of care: Telemetry Medical Status is: Inpatient Remains inpatient appropriate because: Obstructive jaundice, hypotension, leukocytosis   Final disposition: Home   55 minutes with more than 50% spent in reviewing records, counseling patient/family and coordinating care.   Sch Meds:  Scheduled Meds:  heparin  5,000 Units Subcutaneous Q8H   ondansetron  4 mg Oral Once   pantoprazole  20 mg Oral Daily   polyethylene glycol  17 g Oral Daily   senna-docusate  1 tablet Oral BID   sodium chloride flush  3 mL Intravenous Q12H   Continuous Infusions:  sodium chloride     cefTRIAXone (ROCEPHIN)  IV 2 g (08/03/23 1030)   lactated ringers 125 mL/hr at 08/03/23 1026   metronidazole 500 mg (08/03/23 1135)   PRN Meds:.sodium chloride, hydrocortisone, melatonin, ondansetron **OR** ondansetron (ZOFRAN) IV, sodium chloride flush  Antimicrobials: Anti-infectives (From admission, onward)    Start     Dose/Rate Route Frequency Ordered Stop   08/03/23 1100  cefTRIAXone (ROCEPHIN) 2 g in sodium chloride 0.9 % 100 mL IVPB        2 g 200 mL/hr over 30 Minutes Intravenous Every 24 hours 08/03/23 1009     08/03/23 1100  metroNIDAZOLE (FLAGYL) IVPB 500 mg        500 mg 100 mL/hr over 60 Minutes Intravenous Every 12 hours 08/03/23 1009          I have personally reviewed the following labs and images: CBC: Recent Labs  Lab 08/02/23 2016 08/03/23 0643  WBC 18.8* 21.0*  NEUTROABS 17.3*  --   HGB 16.0 13.4  HCT 46.3 38.9*  MCV 89.7 89.6  PLT 257 192   BMP &GFR Recent Labs  Lab 08/02/23 2016 08/03/23 0643  NA 139 135  K 3.8 3.8  CL 104 105  CO2 21* 24  GLUCOSE 147* 146*  BUN 10 16  CREATININE 1.62* 2.17*  CALCIUM 9.2 8.2*   Estimated Creatinine Clearance: 40.4  mL/min (A) (by C-G formula based on SCr of 2.17 mg/dL (H)). Liver & Pancreas: Recent Labs  Lab 08/02/23 2016 08/03/23 0642 08/03/23 0643  AST 319*  --  174*  ALT 533*  --  337*  ALKPHOS 93  --  77  BILITOT 8.8* 7.3* 7.4*  PROT 6.7  --  5.4*  ALBUMIN 3.8  --  3.0*   Recent Labs  Lab 08/02/23 2016 08/03/23 0111  LIPASE 26 26   No results for input(s): "AMMONIA" in the last 168 hours. Diabetic: No results for input(s): "HGBA1C" in the last 72 hours. No results for input(s): "GLUCAP" in the last 168 hours. Cardiac Enzymes: No results for input(s): "CKTOTAL", "CKMB", "CKMBINDEX", "TROPONINI" in the last 168 hours. No results for input(s): "PROBNP" in the last 8760 hours. Coagulation Profile: Recent Labs  Lab 08/03/23 0643  INR 1.5*   Thyroid Function Tests: Recent Labs    08/03/23 0643  TSH 0.378   Lipid Profile: No results for input(s): "CHOL", "HDL", "LDLCALC", "TRIG", "CHOLHDL", "LDLDIRECT" in  the last 72 hours. Anemia Panel: No results for input(s): "VITAMINB12", "FOLATE", "FERRITIN", "TIBC", "IRON", "RETICCTPCT" in the last 72 hours. Urine analysis:    Component Value Date/Time   COLORURINE AMBER (A) 08/02/2023 2007   APPEARANCEUR HAZY (A) 08/02/2023 2007   LABSPEC 1.018 08/02/2023 2007   PHURINE 5.0 08/02/2023 2007   GLUCOSEU NEGATIVE 08/02/2023 2007   HGBUR NEGATIVE 08/02/2023 2007   BILIRUBINUR NEGATIVE 08/02/2023 2007   BILIRUBINUR neg 08/31/2014 1207   KETONESUR 5 (A) 08/02/2023 2007   PROTEINUR 30 (A) 08/02/2023 2007   UROBILINOGEN 0.2 08/31/2014 1207   NITRITE NEGATIVE 08/02/2023 2007   LEUKOCYTESUR NEGATIVE 08/02/2023 2007   Sepsis Labs: Invalid input(s): "PROCALCITONIN", "LACTICIDVEN"  Microbiology: No results found for this or any previous visit (from the past 240 hours).  Radiology Studies: CT ABDOMEN PELVIS W CONTRAST Result Date: 08/03/2023 CLINICAL DATA:  Abdominal pain, nausea, vomiting. EXAM: CT ABDOMEN AND PELVIS WITH CONTRAST  TECHNIQUE: Multidetector CT imaging of the abdomen and pelvis was performed using the standard protocol following bolus administration of intravenous contrast. RADIATION DOSE REDUCTION: This exam was performed according to the departmental dose-optimization program which includes automated exposure control, adjustment of the mA and/or kV according to patient size and/or use of iterative reconstruction technique. CONTRAST:  75mL OMNIPAQUE IOHEXOL 350 MG/ML SOLN COMPARISON:  None Available. FINDINGS: Lower chest: No acute abnormality Hepatobiliary: No focal liver abnormality is seen. Status post cholecystectomy. No biliary dilatation. Pancreas: No focal abnormality or ductal dilatation. Spleen: No focal abnormality.  Normal size. Adrenals/Urinary Tract: No adrenal abnormality. No focal renal abnormality. No stones or hydronephrosis. Urinary bladder is unremarkable. Stomach/Bowel: Normal appendix. Stomach, large and small bowel grossly unremarkable. No bowel obstruction or inflammatory process. Vascular/Lymphatic: No evidence of aneurysm or adenopathy. Reproductive: Prostate enlargement Other: No free fluid or free air. Musculoskeletal: No acute bony abnormality. IMPRESSION: No acute findings in the abdomen or pelvis. Electronically Signed   By: Charlett Nose M.D.   On: 08/03/2023 00:57      Leylany Nored T. Shakura Cowing [facility]  If 7PM-7AM, please contact night-coverage [email or web address] 08/03/2023, 11:40 AM

## 2023-08-03 NOTE — Consult Note (Addendum)
 Consultation  Referring Provider:     [facility] Primary Care Physician:  Eustaquio Boyden, MD Primary Gastroenterologist:       Dr. Elnoria Howard  Reason for Consultation:     Abdominal pain, nausea, vomiting, elevated transaminases, hyperbilirubinemia and leukocytosis         HPI:   Joel Morales is a 63 y.o. male with a past medical history noteworthy for HTN, arthritis, anxiety and cholecystectomy 04/2023 who is seen in consultation for abdominal pain, nausea, vomiting and elevated LFTs.  In speaking with Joel Morales and his wife as well as reviewing his prior medical records he reports developing symptoms of nausea, vomiting and abdominal pain and fall/winter 2024.  He was admitted to the hospital 04/2023 for acute cholecystitis with mildly elevated total bilirubin.  Ultrasound imaging at that time showed a dilated gallbladder with some sludge but no stones, borderline wall thickening and positive Murphy sign.  He underwent uncomplicated laparoscopic cholecystectomy and recovered uneventfully.  States that he generally fared well postoperatively but did note some abdominal bloating and tightness over the ensuing months.  No new medications, no recent travel, no alcohol use  On 08/01/2023 he developed the onset of nausea, vomiting and abdominal pain reminiscent of his previous biliary colic.  Notes that he had chills and shaking but no fever.  Has noted dark urine.  Wife states that he has looked yellow around eyes.  Denies diarrhea.  Reports that he has had slight symptoms of constipation.  Presented to ED 08/02/2023 with hypotension BP 87/66, heart rate 116 CBC - leukocytosis with WBC 18.8 CMP - AST/ALT 319/553, elevated bilirubin 8.8, alk phos 93 Fractionated bilirubin-total bili 7.3, direct bili 4.6, indirect bili 2. Lipase normal Acute hepatitis panel negative Acetaminophen level <10 CTAP -no acute findings MRCP results pending    Past Medical History:  Diagnosis Date    Achilles tendinitis, right leg 07/20/2018   Allergy    Anxiety    Arthritis    Hypertension    Left tennis elbow 07/20/2018    Past Surgical History:  Procedure Laterality Date   CHOLECYSTECTOMY N/A 05/03/2023   LAPAROSCOPIC CHOLECYSTECTOMY WITH INTRAOPERATIVE CHOLANGIOGRAM;  Surgeon: Manus Rudd, MD;  Location: [facility] OR   COLONOSCOPY  09/2011   TA, diverticulosis, rpt 5 yrs Elnoria Howard)   COLONOSCOPY  08/2022   TAx8, diverticulosis, rpt 3 yrs Elnoria Howard)   TONSILLECTOMY      Family History  Problem Relation Age of Onset   Heart disease Mother 54       CABG x 3   Hypertension Mother    COPD Mother    Hypertension Maternal Grandmother    Stroke Maternal Grandmother    Stroke Maternal Grandfather    Cancer Maternal Grandfather        stomach    Social History   Tobacco Use   Smoking status: Never   Smokeless tobacco: Never  Vaping Use   Vaping status: Never Used  Substance Use Topics   Alcohol use: Yes    Alcohol/week: 0.0 standard drinks of alcohol   Drug use: No    Prior to Admission medications   Medication Sig Start Date End Date Taking? Authorizing Provider  acetaminophen (TYLENOL) 500 MG tablet Take 2 tablets (1,000 mg total) by mouth every 6 (six) hours. Patient taking differently: Take 500 mg by mouth daily as needed for mild pain (pain score 1-3). 05/03/23  Yes SimaanFrancine Graven, PA-C  diphenhydrAMINE (BENADRYL) 25 MG tablet Take 25 mg by  mouth every 6 (six) hours as needed for sleep.   Yes [provider]  fluticasone (FLONASE) 50 MCG/ACT nasal spray Place daily into both nostrils.   Yes [provider]  levocetirizine (XYZAL) 5 MG tablet Take 5 mg by mouth every evening.   Yes [provider]  losartan (COZAAR) 50 MG tablet Take 1 tablet (50 mg total) by mouth daily. 01/02/23  Yes Eustaquio Boyden, MD  Melatonin 10 MG TABS Take 10 mg by mouth at bedtime as needed (sleep).   Yes [provider]  Multiple Vitamin (MULTIVITAMIN)  tablet Take 1 tablet by mouth daily.   Yes [provider]  ondansetron (ZOFRAN-ODT) 4 MG disintegrating tablet Take 1 tablet (4 mg total) by mouth every 8 (eight) hours as needed. 08/02/23  Yes Viviano Simas, FNP  propranolol (INDERAL) 40 MG tablet Take 0.5 tablets (20 mg total) by mouth 2 (two) times daily. 01/02/23  Yes Eustaquio Boyden, MD  tamsulosin (FLOMAX) 0.4 MG CAPS capsule Take 1 capsule (0.4 mg total) by mouth daily after supper. 01/02/23  Yes Eustaquio Boyden, MD    Current Facility-Administered Medications  Medication Dose Route Frequency Provider Last Rate Last Admin   0.9 %  sodium chloride infusion  250 mL Intravenous PRN Janalyn Shy, Subrina, MD       cefTRIAXone (ROCEPHIN) 2 g in sodium chloride 0.9 % 100 mL IVPB  2 g Intravenous Q24H Candelaria Stagers T, MD 200 mL/hr at 08/03/23 1030 2 g at 08/03/23 1030   heparin injection 5,000 Units  5,000 Units Subcutaneous Q8H Sundil, Subrina, MD   5,000 Units at 08/03/23 1538   hydrocortisone (ANUSOL-HC) suppository 25 mg  25 mg Rectal BID PRN Janalyn Shy, Subrina, MD       lactated ringers infusion   Intravenous Continuous Janalyn Shy, Subrina, MD 125 mL/hr at 08/03/23 1541 New Bag at 08/03/23 1541   melatonin tablet 10 mg  10 mg Oral QHS PRN Sundil, Subrina, MD       metroNIDAZOLE (FLAGYL) IVPB 500 mg  500 mg Intravenous Q12H Candelaria Stagers T, MD 100 mL/hr at 08/03/23 1135 500 mg at 08/03/23 1135   ondansetron (ZOFRAN) tablet 4 mg  4 mg Oral Q6H PRN Janalyn Shy, Subrina, MD       Or   ondansetron [facility]) injection 4 mg  4 mg Intravenous Q6H PRN Janalyn Shy, Subrina, MD   4 mg at 08/03/23 0343   ondansetron (ZOFRAN-ODT) disintegrating tablet 4 mg  4 mg Oral Once Groce, Christopher F, PA-C       pantoprazole (PROTONIX) EC tablet 20 mg  20 mg Oral Daily Sundil, Subrina, MD       polyethylene glycol (MIRALAX / GLYCOLAX) packet 17 g  17 g Oral Daily Sundil, Subrina, MD   17 g at 08/03/23 0159   senna-docusate (Senokot-S) tablet 1 tablet  1 tablet Oral BID Janalyn Shy,  Subrina, MD   1 tablet at 08/03/23 0804   sodium chloride flush (NS) 0.9 % injection 3 mL  3 mL Intravenous Q12H Sundil, Subrina, MD   3 mL at 08/03/23 0805   sodium chloride flush (NS) 0.9 % injection 3 mL  3 mL Intravenous PRN Janalyn Shy Subrina, MD        Allergies as of 08/02/2023   (No Known Allergies)     Review of Systems:    Constitutional: No weight loss, fever, chills, weakness or fatigue HEENT: Eyes: No change in vision               Ears,  Nose, Throat:  No change in hearing or congestion Skin: No rash or itching Cardiovascular: No chest pain, chest pressure or palpitations   Respiratory: No SOB or cough Gastrointestinal: See HPI and otherwise negative Genitourinary: No dysuria or change in urinary frequency Neurological: No headache, dizziness or syncope Musculoskeletal: No new muscle or joint pain Hematologic: No bleeding or bruising Psychiatric: No history of depression or anxiety     Physical Exam:  Vital signs in last 24 hours: Temp:  [97.8 F (36.6 C)-99.2 F (37.3 C)] 99.1 F (37.3 C) (03/15 1159) Pulse Rate:  [78-116] 85 (03/15 1159) Resp:  [16-28] 18 (03/15 1159) BP: (87-115)/(52-87) 115/58 (03/15 1159) SpO2:  [93 %-99 %] 99 % (03/15 1159) Weight:  [95.3 kg] 95.3 kg (03/14 2006) Last BM Date : 08/03/23 General:   Pleasant  Male appears to be in NAD, Well developed, Well nourished, alert and cooperative Head:  Normocephalic and atraumatic. Eyes:   PEERL, EOMI. + icterus. Conjunctiva pink. Ears:  Normal auditory acuity. Neck:  Supple Throat: Oral cavity and pharynx without inflammation, swelling or lesion. Teeth in good condition. Lungs: Respirations even and unlabored. Lungs clear to auscultation bilaterally.   No wheezes, crackles, or rhonchi.  Heart: Normal S1, S2. No MRG. Regular rate and rhythm. No peripheral edema, cyanosis or pallor.  Abdomen:  Soft, nondistended, + tenderness to palpation in the epigastrium,no rebound or guarding. Normal bowel  sounds. No appreciable masses or hepatomegaly. Rectal:  Not performed.  Msk:  Symmetrical without gross deformities. Peripheral pulses intact.  Extremities:  Without edema, no deformity or joint abnormality. Normal ROM, normal sensation. Neurologic:  Alert and  oriented x4;  grossly normal neurologically. CN II-XII intact.  Skin:    + Jaundice Psychiatric: Demonstrates good judgement and reason without abnormal affect or behaviors.   LAB RESULTS: Recent Labs    08/02/23 2016 08/03/23 0643  WBC 18.8* 21.0*  HGB 16.0 13.4  HCT 46.3 38.9*  PLT 257 192   BMET Recent Labs    08/02/23 2016 08/03/23 0643  NA 139 135  K 3.8 3.8  CL 104 105  CO2 21* 24  GLUCOSE 147* 146*  BUN 10 16  CREATININE 1.62* 2.17*  CALCIUM 9.2 8.2*   LFT Recent Labs    08/03/23 0642 08/03/23 0643  PROT  --  5.4*  ALBUMIN  --  3.0*  AST  --  174*  ALT  --  337*  ALKPHOS  --  77  BILITOT 7.3* 7.4*  BILIDIR 4.6*  --   IBILI 2.7*  --    PT/INR Recent Labs    08/03/23 0643  LABPROT 18.7*  INR 1.5*    STUDIES: CT ABDOMEN PELVIS W CONTRAST Result Date: 08/03/2023 CLINICAL DATA:  Abdominal pain, nausea, vomiting. EXAM: CT ABDOMEN AND PELVIS WITH CONTRAST TECHNIQUE: Multidetector CT imaging of the abdomen and pelvis was performed using the standard protocol following bolus administration of intravenous contrast. RADIATION DOSE REDUCTION: This exam was performed according to the departmental dose-optimization program which includes automated exposure control, adjustment of the mA and/or kV according to patient size and/or use of iterative reconstruction technique. CONTRAST:  75mL OMNIPAQUE IOHEXOL 350 MG/ML SOLN COMPARISON:  None Available. FINDINGS: Lower chest: No acute abnormality Hepatobiliary: No focal liver abnormality is seen. Status post cholecystectomy. No biliary dilatation. Pancreas: No focal abnormality or ductal dilatation. Spleen: No focal abnormality.  Normal size. Adrenals/Urinary Tract:  No adrenal abnormality. No focal renal abnormality. No stones or hydronephrosis. Urinary bladder is unremarkable. Stomach/Bowel:  Normal appendix. Stomach, large and small bowel grossly unremarkable. No bowel obstruction or inflammatory process. Vascular/Lymphatic: No evidence of aneurysm or adenopathy. Reproductive: Prostate enlargement Other: No free fluid or free air. Musculoskeletal: No acute bony abnormality. IMPRESSION: No acute findings in the abdomen or pelvis. Electronically Signed   By: Charlett Nose M.D.   On: 08/03/2023 00:57     PREVIOUS ENDOSCOPIES:            Colonoscopy 08/2022   Colonoscopy 06/2011   Impression / Plan:  63 yo with a past medical history noteworthy for HTN, arthritis, anxiety and cholecystectomy 04/2023 who is seen in consultation for abdominal pain, nausea, vomiting, scleral icterus and elevated LFTs/direct hyperbilirubinemia and leukocytosis. constellation of findings concerning for possible retained stone or sludge status post cholecystectomy.  A form of acute hepatitis may be in the differential diagnosis although less likely.-acute hepatitis panel and acetaminophen level normal.  Anti-smooth muscle antibody pending.  Awaiting results of MRCP.  Mr. Dobie has had a history of mild constipation.  Reasonable to continue MiraLAX and senna.  Recommendations: Await final MRCP result Okay to continue diet today.  Please make n.p.o. after midnight tonight in the event MRCP result warrants ERCP on 08/04/2023 Daily CBC and CMP Continue Rocephin and Flagyl the setting of leukocytosis Monitor fever curve Follow-up anti-smooth muscle antibody IgG Continue Protonix 20 mg orally daily Continue MiraLAX 1 cap daily in conjunction with senna twice daily Notify GI team if signs or symptoms of cholangitis evolve  Thank you for your kind consultation, we will continue to follow.  Durene Romans Hayzel Ruberg  08/03/2023, 3:46 PM

## 2023-08-03 NOTE — ED Notes (Signed)
 ED TO INPATIENT HANDOFF REPORT  ED Nurse Name and Phone #: Marcie Bal RN, [phone]  S Name/Age/Gender Joel Morales 63 y.o. male Room/Bed: 021C/021C  Code Status   Code Status: Full Code  Home/SNF/Other Home Patient oriented to: self, place, time, and situation Is this baseline? Yes   Triage Complete: Triage complete  Chief Complaint Transaminitis [R74.01]  Triage Note Abd pain with n v for 2 days  no bm since Wednesday am   Allergies No Known Allergies  Level of Care/Admitting Diagnosis ED Disposition     ED Disposition  Admit   Condition  --   Comment  Hospital Area: [facility] [[id number]]  Level of Care: Telemetry Medical [104]  May admit patient to [facility] or [facility] if equivalent level of care is available:: No  Covid Evaluation: Asymptomatic - no recent exposure (last 10 days) testing not required  Diagnosis: Transaminitis [287136]  Admitting Physician: Tereasa Coop [[id number]]  Attending Physician: Tereasa Coop [[id number]]  Certification:: I certify this patient will need inpatient services for at least 2 midnights  Expected Medical Readiness: 08/07/2023          B Medical/Surgery History Past Medical History:  Diagnosis Date   Achilles tendinitis, right leg 07/20/2018   Allergy    Anxiety    Arthritis    Hypertension    Left tennis elbow 07/20/2018   Past Surgical History:  Procedure Laterality Date   CHOLECYSTECTOMY N/A 05/03/2023   LAPAROSCOPIC CHOLECYSTECTOMY WITH INTRAOPERATIVE CHOLANGIOGRAM;  Surgeon: Manus Rudd, MD;  Location: [facility] OR   COLONOSCOPY  09/2011   TA, diverticulosis, rpt 5 yrs Elnoria Howard)   COLONOSCOPY  08/2022   TAx8, diverticulosis, rpt 3 yrs Elnoria Howard)   TONSILLECTOMY       A IV Location/Drains/Wounds Patient Lines/Drains/Airways Status     Active Line/Drains/Airways     Name Placement date Placement time Site Days   Peripheral IV 08/03/23 20 G Right Antecubital 08/03/23  0007  Antecubital   less than 1   Incision - 4 Ports Abdomen 1: Mid;Upper 2: Umbilicus 3: Right 4: Right;Lateral 05/03/23  0750  -- 92            Intake/Output Last 24 hours No intake or output data in the 24 hours ending 08/03/23 0143  Labs/Imaging Results for orders placed or performed during the hospital encounter of 08/02/23 (from the past 48 hours)  Urinalysis, Routine w reflex microscopic -Urine, Clean Catch     Status: Abnormal   Collection Time: 08/02/23  8:07 PM  Result Value Ref Range   Color, Urine AMBER (A) YELLOW    Comment: BIOCHEMICALS MAY BE AFFECTED BY COLOR   APPearance HAZY (A) CLEAR   Specific Gravity, Urine 1.018 1.005 - 1.030   pH 5.0 5.0 - 8.0   Glucose, UA NEGATIVE NEGATIVE mg/dL   Hgb urine dipstick NEGATIVE NEGATIVE   Bilirubin Urine NEGATIVE NEGATIVE   Ketones, ur 5 (A) NEGATIVE mg/dL   Protein, ur 30 (A) NEGATIVE mg/dL   Nitrite NEGATIVE NEGATIVE   Leukocytes,Ua NEGATIVE NEGATIVE   RBC / HPF 0-5 0 - 5 RBC/hpf   WBC, UA 0-5 0 - 5 WBC/hpf   Bacteria, UA NONE SEEN NONE SEEN   Squamous Epithelial / HPF 0-5 0 - 5 /HPF   Mucus PRESENT    Amorphous Crystal PRESENT     Comment: Performed at [facility], [address] [place], [place] [address]  CBC with Differential     Status: Abnormal  Collection Time: 08/02/23  8:16 PM  Result Value Ref Range   WBC 18.8 (H) 4.0 - 10.5 K/uL   RBC 5.16 4.22 - 5.81 MIL/uL   Hemoglobin 16.0 13.0 - 17.0 g/dL   HCT 16.1 09.6 - 04.5 %   MCV 89.7 80.0 - 100.0 fL   MCH 31.0 26.0 - 34.0 pg   MCHC 34.6 30.0 - 36.0 g/dL   RDW 40.9 81.1 - 91.4 %   Platelets 257 150 - 400 K/uL   nRBC 0.0 0.0 - 0.2 %   Neutrophils Relative % 91 %   Neutro Abs 17.3 (H) 1.7 - 7.7 K/uL   Lymphocytes Relative 2 %   Lymphs Abs 0.3 (L) 0.7 - 4.0 K/uL   Monocytes Relative 6 %   Monocytes Absolute 1.1 (H) 0.1 - 1.0 K/uL   Eosinophils Relative 0 %   Eosinophils Absolute 0.0 0.0 - 0.5 K/uL   Basophils Relative 0 %   Basophils Absolute 0.0 0.0 - 0.1 K/uL    Immature Granulocytes 1 %   Abs Immature Granulocytes 0.10 (H) 0.00 - 0.07 K/uL    Comment: Performed at [facility], [address] [place], [place] [address]  Comprehensive metabolic panel     Status: Abnormal   Collection Time: 08/02/23  8:16 PM  Result Value Ref Range   Sodium 139 135 - 145 mmol/L   Potassium 3.8 3.5 - 5.1 mmol/L   Chloride 104 98 - 111 mmol/L   CO2 21 (L) 22 - 32 mmol/L   Glucose, Bld 147 (H) 70 - 99 mg/dL    Comment: Glucose reference range applies only to samples taken after fasting for at least 8 hours.   BUN 10 8 - 23 mg/dL   Creatinine, Ser 6.21 (H) 0.61 - 1.24 mg/dL   Calcium 9.2 8.9 - 30.8 mg/dL   Total Protein 6.7 6.5 - 8.1 g/dL   Albumin 3.8 3.5 - 5.0 g/dL   AST 657 (H) 15 - 41 U/L   ALT 533 (H) 0 - 44 U/L   Alkaline Phosphatase 93 38 - 126 U/L   Total Bilirubin 8.8 (H) 0.0 - 1.2 mg/dL   GFR, Estimated 47 (L) >60 mL/min    Comment: (NOTE) Calculated using the CKD-EPI Creatinine Equation (2021)    Anion gap 14 5 - 15    Comment: Performed at [facility], [address] [place], [place] [address]  Lipase, blood     Status: None   Collection Time: 08/02/23  8:16 PM  Result Value Ref Range   Lipase 26 11 - 51 U/L    Comment: Performed at [facility], [address] [place], [place] [address]   CT ABDOMEN PELVIS W CONTRAST Result Date: 08/03/2023 CLINICAL DATA:  Abdominal pain, nausea, vomiting. EXAM: CT ABDOMEN AND PELVIS WITH CONTRAST TECHNIQUE: Multidetector CT imaging of the abdomen and pelvis was performed using the standard protocol following bolus administration of intravenous contrast. RADIATION DOSE REDUCTION: This exam was performed according to the departmental dose-optimization program which includes automated exposure control, adjustment of the mA and/or kV according to patient size and/or use of iterative reconstruction technique. CONTRAST:  75mL OMNIPAQUE IOHEXOL 350 MG/ML SOLN COMPARISON:  None Available. FINDINGS:  Lower chest: No acute abnormality Hepatobiliary: No focal liver abnormality is seen. Status post cholecystectomy. No biliary dilatation. Pancreas: No focal abnormality or ductal dilatation. Spleen: No focal abnormality.  Normal size. Adrenals/Urinary Tract: No adrenal abnormality. No focal renal abnormality. No stones  or hydronephrosis. Urinary bladder is unremarkable. Stomach/Bowel: Normal appendix. Stomach, large and small bowel grossly unremarkable. No bowel obstruction or inflammatory process. Vascular/Lymphatic: No evidence of aneurysm or adenopathy. Reproductive: Prostate enlargement Other: No free fluid or free air. Musculoskeletal: No acute bony abnormality. IMPRESSION: No acute findings in the abdomen or pelvis. Electronically Signed   By: Charlett Nose M.D.   On: 08/03/2023 00:57    Pending Labs Unresulted Labs (From admission, onward)     Start     Ordered   08/03/23 0500  Comprehensive metabolic panel  Tomorrow morning,   R        08/03/23 0134   08/03/23 0500  CBC  Tomorrow morning,   R        08/03/23 0134   08/03/23 0137  Hepatitis panel, acute  Add-on,   AD        08/03/23 0136   08/03/23 0135  Anti-smooth muscle antibody, IgG  Add-on,   AD        08/03/23 0134   08/03/23 0135  APTT  Add-on,   AD        08/03/23 0134   08/03/23 0135  Protime-INR  Add-on,   AD        08/03/23 0134   08/03/23 0124  Lipase, blood  Add-on,   AD        08/03/23 0123   08/03/23 0111  Acetaminophen level  Once,   STAT        08/03/23 0111   08/03/23 0111  Hepatitis panel, acute  Once,   URGENT        08/03/23 0111            Vitals/Pain Today's Vitals   08/02/23 2345 08/03/23 0000 08/03/23 0008 08/03/23 0015  BP: 107/66 103/70  104/65  Pulse: 94 89  93  Resp: (!) 23 (!) 28  (!) 27  Temp:      TempSrc:      SpO2: 93% 94%  96%  Weight:      Height:      PainSc:   7      Isolation Precautions No active isolations  Medications Medications  ondansetron (ZOFRAN-ODT) disintegrating  tablet 4 mg (0 mg Oral Hold 08/03/23 0016)  propranolol (INDERAL) tablet 20 mg (has no administration in time range)  melatonin tablet 10 mg (has no administration in time range)  heparin injection 5,000 Units (has no administration in time range)  polyethylene glycol (MIRALAX / GLYCOLAX) packet 17 g (has no administration in time range)  senna-docusate (Senokot-S) tablet 1 tablet (has no administration in time range)  lactated ringers infusion (has no administration in time range)  sodium chloride flush (NS) 0.9 % injection 3 mL (has no administration in time range)  sodium chloride flush (NS) 0.9 % injection 3 mL (has no administration in time range)  0.9 %  sodium chloride infusion (has no administration in time range)  ondansetron (ZOFRAN) tablet 4 mg (has no administration in time range)    Or  ondansetron (ZOFRAN) injection 4 mg (has no administration in time range)  sodium chloride 0.9 % bolus 1,000 mL (1,000 mLs Intravenous New Bag/Given 08/03/23 0009)  metoCLOPramide (REGLAN) injection 10 mg (10 mg Intravenous Given 08/03/23 0009)  fentaNYL (SUBLIMAZE) injection 50 mcg (50 mcg Intravenous Given 08/03/23 0008)  iohexol (OMNIPAQUE) 350 MG/ML injection 75 mL (75 mLs Intravenous Contrast Given 08/03/23 0050)    Mobility walks     Focused Assessments GI Assessment, abdominal pain  R Recommendations: See Admitting Provider Note  Report given to:   Additional Notes:

## 2023-08-03 NOTE — Plan of Care (Signed)
 1700-1900 Patient AAOx4, LR running at 147mL/hr via [facility]. Patient complained of chest tightness to middle chest radiating to "left nipple". Stated he feels like he's having a panic attack and some abdominal cramping. Vitals taken, MD notified. Patient given PRN zofran, while administering patient stated chest tightness is getting better, but abdominal spasms continue. Safety precautions maintained.

## 2023-08-03 NOTE — H&P (Signed)
 History and Physical    Joel Morales:[id number] DOB: 31-Aug-1960 DOA: 08/02/2023  PCP: Eustaquio Boyden, MD   Patient coming from: Home   Chief Complaint:  Chief Complaint  Patient presents with   Abdominal Pain   ED TRIAGE note:  Abd pain with n v for 2 days  no bm since Wednesday am      HPI:  Joel Morales is a 63 y.o. male with medical history significant of cholecystitis s/p cholecystectomy in December 2024, essential hypertension, hemorrhoid, BPH, insomnia and GERD presented to emergency department complaining of abdominal pain for last 1 day which has been progressively getting worse throughout the day with associated nausea, vomiting and poor oral tolerance.  Patient reported no bowel movement for 2 days.  Patient's wife at the bedside reporting that patient is appearing more green/yellowish appearing. Patient denies any history of alcohol use.  Reported intermittent using up Tylenol. Patient is complaining about midepigastric abdominal pain which is constant in nature.  Denies any fever and chill.  Denies any recent travel outside of the [place]. Patient denies any chest pain, palpitation, shortness of breath, headache, blurry vision, nausea, vomiting, diarrhea, and pale stool.   ED Course:  At presentation to ED patient found hypotensive blood pressure dropped to 87/66 which has been improved to 104/65.  Tachycardia 116 tachypnea 27.  O2 sat 93 to 97% room air. CBC showing leukocytosis 18.8 otherwise unremarkable. CM showing significant AST/ALT 319/553, elevated bilirubin 8.8, low GFR 47, elevated creatinine 1.62, low bicarb 21.  Pending hepatitis panel Tylenol level.  CT abdomen pelvis no acute intra-abdominal finding.  Patient's follows with [facility] Dr. Elnoria Howard.  ED physician is going to consult and since secure chat message to Dr. Elnoria Howard for evaluate the patient.  Hospitalist has been consulted for further evaluation management of significant  transaminitis, hyperbilirubinemia as well as nausea, vomiting and abdominal pain.  Significant labs in the ED: Lab Orders         CBC with Differential         Comprehensive metabolic panel         Lipase, blood         Urinalysis, Routine w reflex microscopic -Urine, Clean Catch         Acetaminophen level         Hepatitis panel, acute         Lipase, blood         Anti-smooth muscle antibody, IgG         Comprehensive metabolic panel         CBC         APTT         Protime-INR         Hepatitis panel, acute       Review of Systems:  Review of Systems  Constitutional:  Negative for chills, fever, malaise/fatigue and weight loss.  Respiratory:  Negative for cough, sputum production and shortness of breath.   Cardiovascular:  Negative for chest pain, palpitations and leg swelling.  Gastrointestinal:  Positive for abdominal pain, nausea and vomiting. Negative for blood in stool, constipation, diarrhea, heartburn and melena.  Musculoskeletal:  Negative for myalgias and neck pain.  Neurological:  Negative for dizziness and headaches.  Endo/Heme/Allergies:  Does not bruise/bleed easily.  Psychiatric/Behavioral:  The patient is not nervous/anxious.   All other systems reviewed and are negative.   Past Medical History:  Diagnosis Date   Achilles tendinitis, right leg 07/20/2018  Allergy    Anxiety    Arthritis    Hypertension    Left tennis elbow 07/20/2018    Past Surgical History:  Procedure Laterality Date   CHOLECYSTECTOMY N/A 05/03/2023   LAPAROSCOPIC CHOLECYSTECTOMY WITH INTRAOPERATIVE CHOLANGIOGRAM;  Surgeon: Manus Rudd, MD;  [facility] OR   COLONOSCOPY  09/2011   TA, diverticulosis, rpt 5 yrs Elnoria Howard)   COLONOSCOPY  08/2022   TAx8, diverticulosis, rpt 3 yrs Elnoria Howard)   TONSILLECTOMY       reports that he has never smoked. He has never used smokeless tobacco. He reports current alcohol use. He reports that he does not use drugs.  No Known Allergies  Family  History  Problem Relation Age of Onset   Heart disease Mother 7       CABG x 3   Hypertension Mother    COPD Mother    Hypertension Maternal Grandmother    Stroke Maternal Grandmother    Stroke Maternal Grandfather    Cancer Maternal Grandfather        stomach    Prior to Admission medications   Medication Sig Start Date End Date Taking? Authorizing Provider  acetaminophen (TYLENOL) 500 MG tablet Take 2 tablets (1,000 mg total) by mouth every 6 (six) hours. 05/03/23   Adam Phenix, PA-C  diphenhydrAMINE (BENADRYL) 25 MG tablet Take 25 mg by mouth every 6 (six) hours as needed for sleep.    [provider]  fluticasone (FLONASE) 50 MCG/ACT nasal spray Place daily into both nostrils.    [provider]  ibuprofen (ADVIL) 600 MG tablet Take 1 tablet (600 mg total) by mouth 3 (three) times daily. 05/03/23   Adam Phenix, PA-C  levocetirizine (XYZAL) 5 MG tablet Take 5 mg by mouth every evening.    [provider]  losartan (COZAAR) 50 MG tablet Take 1 tablet (50 mg total) by mouth daily. 01/02/23   Eustaquio Boyden, MD  Melatonin 10 MG TABS Take 10 mg by mouth at bedtime as needed (sleep).    [provider]  Multiple Vitamin (MULTIVITAMIN) tablet Take 1 tablet by mouth daily.    [provider]  ondansetron (ZOFRAN-ODT) 4 MG disintegrating tablet Take 1 tablet (4 mg total) by mouth every 8 (eight) hours as needed. 08/02/23   Viviano Simas, FNP  oxyCODONE (OXY IR/ROXICODONE) 5 MG immediate release tablet Take 1 tablet (5 mg total) by mouth every 6 (six) hours as needed for breakthrough pain (not relieved by tylenol or advil). 05/03/23   Adam Phenix, PA-C  polyethylene glycol (MIRALAX / GLYCOLAX) 17 g packet Take 17 g by mouth daily as needed for mild constipation. 05/03/23   Adam Phenix, PA-C  propranolol (INDERAL) 40 MG tablet Take 0.5 tablets (20 mg total) by mouth 2 (two) times daily. 01/02/23   Eustaquio Boyden, MD   tamsulosin (FLOMAX) 0.4 MG CAPS capsule Take 1 capsule (0.4 mg total) by mouth daily after supper. 01/02/23   Eustaquio Boyden, MD     Physical Exam: Vitals:   08/02/23 2301 08/02/23 2345 08/03/23 0000 08/03/23 0015  BP: 102/63 107/66 103/70 104/65  Pulse:  94 89 93  Resp:  (!) 23 (!) 28 (!) 27  Temp:      TempSrc:      SpO2:  93% 94% 96%  Weight:      Height:        Physical Exam Vitals and nursing note reviewed.  Constitutional:      Appearance: He is  not ill-appearing.  Cardiovascular:     Rate and Rhythm: Normal rate and regular rhythm.     Heart sounds: Normal heart sounds.  Abdominal:     General: Bowel sounds are normal.     Palpations: Abdomen is soft. There is no hepatomegaly or splenomegaly.     Tenderness: There is abdominal tenderness in the epigastric area. There is no guarding or rebound.  Skin:    General: Skin is dry.     Capillary Refill: Capillary refill takes less than 2 seconds.  Neurological:     Mental Status: He is alert and oriented to person, place, and time.  Psychiatric:        Mood and Affect: Mood normal. Mood is not anxious.        Behavior: Behavior normal.      Labs on Admission: I have personally reviewed following labs and imaging studies  CBC: Recent Labs  Lab 08/02/23 2016  WBC 18.8*  NEUTROABS 17.3*  HGB 16.0  HCT 46.3  MCV 89.7  PLT 257   Basic Metabolic Panel: Recent Labs  Lab 08/02/23 2016  NA 139  K 3.8  CL 104  CO2 21*  GLUCOSE 147*  BUN 10  CREATININE 1.62*  CALCIUM 9.2   GFR: Estimated Creatinine Clearance: 54.1 mL/min (A) (by C-G formula based on SCr of 1.62 mg/dL (H)). Liver Function Tests: Recent Labs  Lab 08/02/23 2016  AST 319*  ALT 533*  ALKPHOS 93  BILITOT 8.8*  PROT 6.7  ALBUMIN 3.8   Recent Labs  Lab 08/02/23 2016 08/03/23 0111  LIPASE 26 26   No results for input(s): "AMMONIA" in the last 168 hours. Coagulation Profile: No results for input(s): "INR", "PROTIME" in the last 168  hours. Cardiac Enzymes: No results for input(s): "CKTOTAL", "CKMB", "CKMBINDEX", "TROPONINI", "TROPONINIHS" in the last 168 hours. BNP (last 3 results) No results for input(s): "BNP" in the last 8760 hours. HbA1C: No results for input(s): "HGBA1C" in the last 72 hours. CBG: No results for input(s): "GLUCAP" in the last 168 hours. Lipid Profile: No results for input(s): "CHOL", "HDL", "LDLCALC", "TRIG", "CHOLHDL", "LDLDIRECT" in the last 72 hours. Thyroid Function Tests: No results for input(s): "TSH", "T4TOTAL", "FREET4", "T3FREE", "THYROIDAB" in the last 72 hours. Anemia Panel: No results for input(s): "VITAMINB12", "FOLATE", "FERRITIN", "TIBC", "IRON", "RETICCTPCT" in the last 72 hours. Urine analysis:    Component Value Date/Time   COLORURINE AMBER (A) 08/02/2023 2007   APPEARANCEUR HAZY (A) 08/02/2023 2007   LABSPEC 1.018 08/02/2023 2007   PHURINE 5.0 08/02/2023 2007   GLUCOSEU NEGATIVE 08/02/2023 2007   HGBUR NEGATIVE 08/02/2023 2007   BILIRUBINUR NEGATIVE 08/02/2023 2007   BILIRUBINUR neg 08/31/2014 1207   KETONESUR 5 (A) 08/02/2023 2007   PROTEINUR 30 (A) 08/02/2023 2007   UROBILINOGEN 0.2 08/31/2014 1207   NITRITE NEGATIVE 08/02/2023 2007   LEUKOCYTESUR NEGATIVE 08/02/2023 2007    Radiological Exams on Admission: I have personally reviewed images CT ABDOMEN PELVIS W CONTRAST Result Date: 08/03/2023 CLINICAL DATA:  Abdominal pain, nausea, vomiting. EXAM: CT ABDOMEN AND PELVIS WITH CONTRAST TECHNIQUE: Multidetector CT imaging of the abdomen and pelvis was performed using the standard protocol following bolus administration of intravenous contrast. RADIATION DOSE REDUCTION: This exam was performed according to the departmental dose-optimization program which includes automated exposure control, adjustment of the mA and/or kV according to patient size and/or use of iterative reconstruction technique. CONTRAST:  75mL OMNIPAQUE IOHEXOL 350 MG/ML SOLN COMPARISON:  None Available.  FINDINGS: Lower chest: No  acute abnormality Hepatobiliary: No focal liver abnormality is seen. Status post cholecystectomy. No biliary dilatation. Pancreas: No focal abnormality or ductal dilatation. Spleen: No focal abnormality.  Normal size. Adrenals/Urinary Tract: No adrenal abnormality. No focal renal abnormality. No stones or hydronephrosis. Urinary bladder is unremarkable. Stomach/Bowel: Normal appendix. Stomach, large and small bowel grossly unremarkable. No bowel obstruction or inflammatory process. Vascular/Lymphatic: No evidence of aneurysm or adenopathy. Reproductive: Prostate enlargement Other: No free fluid or free air. Musculoskeletal: No acute bony abnormality. IMPRESSION: No acute findings in the abdomen or pelvis. Electronically Signed   By: Charlett Nose M.D.   On: 08/03/2023 00:57     EKG: Pending EKG.    Assessment/Plan: Principal Problem:   Transaminitis Active Problems:   Hypotension   Nausea and vomiting   Abdominal pain   Hyperbilirubinemia   Acute kidney injury superimposed on chronic kidney disease (HCC)   Essential hypertension   History of cholecystectomy   History of hemorrhoids   BPH (benign prostatic hyperplasia)   GERD (gastroesophageal reflux disease)   Insomnia    Assessment and Plan: Transaminitis Hyperbilirubinemia Nausea, vomiting and abdominal pain History of cholecystectomy -Patient presenting with complaining of nausea vomiting for 1 day and constipation for 2 days.  Denies any abdominal pain.  At presentation to ED patient found hypotensive which has been improved otherwise hemodynamically stable.  Patient denies any history of alcohol use and Tylenol use. -History of cholecystectomy in 04/2023 -Normal lipase level.  CMP showing elevated AST 319, elevated ALT 533, normal alkaline phosphatase 93, elevated total bilirubin 8.8. -Checking Tylenol level and acute hepatitis panel.  Checking anti-smooth muscle antibody level. -CT abdomen pelvis no  focal hepatic abnormality, no biliary dilation.  Status post cholecystectomy. -Concern for transaminitis in the setting of nausea, vomiting and hypotensive episode. -Patient has been following gastroenterologist Dr. Jeani Hawking outpatient.  Consulting [facility] to evaluate in the daytime. -Continue to avoid medications that can affect hepatic function.  Acute kidney injury on CKD stage II -Elevated creatinine 1.62.  Prerenal acute kidney injury in the setting of hypotension.  In the ED patient has been given 1 L of NS bolus and pressure has been improved significantly.  Continue maintenance fluid LR 125 cc/h. -Continue to monitor renal function, avoid nephrotoxic agent and renally adjust medications.  Hypotension-improving Essential hypertension -At presentation to ED blood pressure dropped to 87/66 MAP 73.  Blood pressure has been improved to 104/60 1:05 liter of LR bolus. -At this point there is no evidence of infection.  Unclear etiology of hypotension except nausea and vomiting and poor oral intake and dehydration. - Continue maintenance fluid LR 125 cc/h. -Holding propranolol, losartan and Flomax in the setting of hypotension.  Constipation - Patient is complaining about no bowel movement for 2 days.  CT abdomen pelvis evidence of SBO.  Continue MiraLAX and Senokot.  BPH -Holding Flomax in the setting of hypotension.  GERD -Continue Protonix  Insomnia -Continue melatonin  History of hemorrhoid -Anusol as needed  DVT prophylaxis:  SQ Heparin Code Status:  Full Code Diet: Full liquid diet. Family Communication:   Family was present at bedside, at the time of interview. Opportunity was given to ask question and all questions were answered satisfactorily.  Disposition Plan: Continue monitor improvement of hepatic function.  Will follow-up with hepatitis panel anti-smooth muscle antibody level. Consults: Gastroenterology Admission status:   Inpatient, Telemetry bed  Severity  of Illness: The appropriate patient status for this patient is INPATIENT. Inpatient status is judged to be  reasonable and necessary in order to provide the required intensity of service to ensure the patient's safety. The patient's presenting symptoms, physical exam findings, and initial radiographic and laboratory data in the context of their chronic comorbidities is felt to place them at high risk for further clinical deterioration. Furthermore, it is not anticipated that the patient will be medically stable for discharge from the hospital within 2 midnights of admission.   * I certify that at the point of admission it is my clinical judgment that the patient will require inpatient hospital care spanning beyond 2 midnights from the point of admission due to high intensity of service, high risk for further deterioration and high frequency of surveillance required.Marland Kitchen    Tereasa Coop, MD [facility]  How to contact the [facility] Attending or Consulting provider 7A - 7P or covering provider during after hours 7P -7A, for this patient.  Check the care team in [facility] and look for a) attending/consulting [facility] provider listed and b) the [facility] team listed Log into [email or web address] and use [facility]'s universal password to access. If you do not have the password, please contact the hospital operator. Locate the [facility] provider you are looking for under [facility] and page to a number that you can be directly reached. If you still have difficulty reaching the provider, please page the [facility] (Director on Call) for the Hospitalists listed on amion for assistance.  08/03/2023, 2:50 AM

## 2023-08-03 NOTE — ED Notes (Signed)
 Patient transported to CT

## 2023-08-04 DIAGNOSIS — R1013 Epigastric pain: Secondary | ICD-10-CM | POA: Diagnosis not present

## 2023-08-04 DIAGNOSIS — K5900 Constipation, unspecified: Secondary | ICD-10-CM | POA: Diagnosis not present

## 2023-08-04 DIAGNOSIS — R7401 Elevation of levels of liver transaminase levels: Secondary | ICD-10-CM | POA: Diagnosis not present

## 2023-08-04 DIAGNOSIS — N2889 Other specified disorders of kidney and ureter: Secondary | ICD-10-CM | POA: Clinically undetermined

## 2023-08-04 LAB — CBC WITH DIFFERENTIAL/PLATELET
Abs Immature Granulocytes: 0.02 10*3/uL (ref 0.00–0.07)
Basophils Absolute: 0 10*3/uL (ref 0.0–0.1)
Basophils Relative: 0 %
Eosinophils Absolute: 0.2 10*3/uL (ref 0.0–0.5)
Eosinophils Relative: 3 %
HCT: 38.4 % — ABNORMAL LOW (ref 39.0–52.0)
Hemoglobin: 13.4 g/dL (ref 13.0–17.0)
Immature Granulocytes: 0 %
Lymphocytes Relative: 12 %
Lymphs Abs: 0.9 10*3/uL (ref 0.7–4.0)
MCH: 30.9 pg (ref 26.0–34.0)
MCHC: 34.9 g/dL (ref 30.0–36.0)
MCV: 88.7 fL (ref 80.0–100.0)
Monocytes Absolute: 1.4 10*3/uL — ABNORMAL HIGH (ref 0.1–1.0)
Monocytes Relative: 19 %
Neutro Abs: 4.7 10*3/uL (ref 1.7–7.7)
Neutrophils Relative %: 66 %
Platelets: 143 10*3/uL — ABNORMAL LOW (ref 150–400)
RBC: 4.33 MIL/uL (ref 4.22–5.81)
RDW: 13.6 % (ref 11.5–15.5)
WBC: 7.1 10*3/uL (ref 4.0–10.5)
nRBC: 0 % (ref 0.0–0.2)

## 2023-08-04 LAB — COMPREHENSIVE METABOLIC PANEL
ALT: 236 U/L — ABNORMAL HIGH (ref 0–44)
AST: 92 U/L — ABNORMAL HIGH (ref 15–41)
Albumin: 2.9 g/dL — ABNORMAL LOW (ref 3.5–5.0)
Alkaline Phosphatase: 72 U/L (ref 38–126)
Anion gap: 10 (ref 5–15)
BUN: 12 mg/dL (ref 8–23)
CO2: 23 mmol/L (ref 22–32)
Calcium: 8.4 mg/dL — ABNORMAL LOW (ref 8.9–10.3)
Chloride: 107 mmol/L (ref 98–111)
Creatinine, Ser: 1.44 mg/dL — ABNORMAL HIGH (ref 0.61–1.24)
GFR, Estimated: 55 mL/min — ABNORMAL LOW (ref >60)
Glucose, Bld: 94 mg/dL (ref 70–99)
Potassium: 3.6 mmol/L (ref 3.5–5.1)
Sodium: 140 mmol/L (ref 135–145)
Total Bilirubin: 5.7 mg/dL — ABNORMAL HIGH (ref 0.0–1.2)
Total Protein: 5.5 g/dL — ABNORMAL LOW (ref 6.5–8.1)

## 2023-08-04 LAB — ANTI-SMOOTH MUSCLE ANTIBODY, IGG: F-Actin IgG: 7 U (ref 0–19)

## 2023-08-04 LAB — MAGNESIUM: Magnesium: 2 mg/dL (ref 1.7–2.4)

## 2023-08-04 MED ORDER — NALOXONE HCL 0.4 MG/ML IJ SOLN: 0.4000 mg | INTRAMUSCULAR | Status: DC | PRN

## 2023-08-04 MED ORDER — HYDROCORTISONE (PERIANAL) 2.5 % EX CREA
TOPICAL_CREAM | Freq: Two times a day (BID) | CUTANEOUS | Status: DC
  Filled 2023-08-04 (×2): qty 28.35

## 2023-08-04 MED ORDER — FENTANYL CITRATE PF 50 MCG/ML IJ SOSY
12.5000 ug | PREFILLED_SYRINGE | Freq: Once | INTRAMUSCULAR | Status: AC | PRN
  Administered 2023-08-04: 12.5 ug via INTRAVENOUS
  Filled 2023-08-04: qty 1

## 2023-08-04 NOTE — Progress Notes (Signed)
 PROGRESS NOTE    Joel Morales  ZOX:[id number] DOB: January 31, 1961 DOA: 08/02/2023 PCP: Eustaquio Boyden, MD     Brief Narrative:  63 year old M with PMH of cholecystitis s/p cholecystectomy in 04/2023, HTN, GERD, BPH, hemorrhoid and insomnia presenting with abdominal pain with associated nausea, vomiting and poor oral tolerance for 1 day, and admitted with hyperbilirubinemia/transaminitis/jaundice.  Is drinking more than one beer a day or excess Tylenol use.  No fever, leukocytosis or URI symptoms.  No lymphadenopathy.  Reportedly had a normal colonoscopy about a year ago.  Followed by Dr. Elnoria Howard.   In ED, slightly hypotensive to 87/66.  HR 116.  RR 27.  WBC 18.8 with left shift.  AST 319.  ALT 553.  Total bili 8.8. Cr 1.62 (baseline 1.1-1.2).  Tylenol and EtOH level negative.  Lipase normal.  CT abdomen and pelvis without acute finding.  Acute hepatitis panel negative.  New events last 24 hours / Subjective: Feels pretty good today. Curious about his scan results. 1.2 x 1.5 cm lesion of upper pole of kidney. Will require outpt urology evaluation.  He denies any new flank pain, blood in the urine, or family history of kidney cancer.  Assessment & Plan:   Principal Problem:   Transaminitis  Improving  Appreciate GI  Continue Rocephin and Flagyl  Active Problems:  Renal Mass  Incidentally noted on MRCP  Will require outpatient urology follow-up    Essential hypertension  Hold home losartan    Hypotension  Resolved    Nausea and vomiting  Resolved    Abdominal pain  Resolved    Hyperbilirubinemia   Acute kidney injury superimposed on chronic kidney disease (HCC)  Improving, continue to monitor CMP    History of cholecystectomy    History of hemorrhoids  Anusol twice daily    BPH (benign prostatic hyperplasia)  Hold Flomax    GERD (gastroesophageal reflux disease)  Protonix 20 mg daily    Insomnia  Melatonin    Constipation  Senna, MiraLAX   DVT prophylaxis:  Heparin Code Status: Full Family Communication: Spouse bedside Coming From: Home Disposition Plan: Hopefully home Barriers to Discharge: Clinical improvement/workup  Consultants:  GI   Antimicrobials:  Anti-infectives (From admission, onward)    Start     Dose/Rate Route Frequency Ordered Stop   08/03/23 1100  cefTRIAXone (ROCEPHIN) 2 g in sodium chloride 0.9 % 100 mL IVPB        2 g 200 mL/hr over 30 Minutes Intravenous Every 24 hours 08/03/23 1009     08/03/23 1100  metroNIDAZOLE (FLAGYL) IVPB 500 mg        500 mg 100 mL/hr over 60 Minutes Intravenous Every 12 hours 08/03/23 1009          Objective: Vitals:   08/03/23 2230 08/04/23 0107 08/04/23 0421 08/04/23 0749  BP: 121/60 124/62 108/66 101/65  Pulse: 88 82 69 70  Resp: 18  18 18   Temp: (!) 97.5 F (36.4 C)  98.5 F (36.9 C) 98.5 F (36.9 C)  TempSrc: Oral  Oral Oral  SpO2: 96%  95% 96%  Weight:      Height:        Intake/Output Summary (Last 24 hours) at 08/04/2023 1425 Last data filed at 08/04/2023 1330 Gross per 24 hour  Intake 415.76 ml  Output 1000 ml  Net -584.24 ml   Filed Weights   08/02/23 2006  Weight: 95.3 kg    Examination:  General exam: Appears calm and comfortable  Respiratory system:  Clear to auscultation. Respiratory effort normal. No respiratory distress. No conversational dyspnea.  Cardiovascular system: S1 & S2 heard, RRR. No murmurs. No pedal edema. Gastrointestinal system: Abdomen is nondistended, soft and nontender. Normal bowel sounds heard. Central nervous system: Alert and oriented. No focal neurological deficits. Speech clear.  Extremities: Symmetric in appearance  Skin: + Jaundice Psychiatry: Judgement and insight appear normal. Mood & affect appropriate.   Data Reviewed: I have personally reviewed following labs and imaging studies  CBC: Recent Labs  Lab 08/02/23 2016 08/03/23 0643 08/04/23 0742  WBC 18.8* 21.0* 7.1  NEUTROABS 17.3*  --  4.7  HGB 16.0 13.4 13.4   HCT 46.3 38.9* 38.4*  MCV 89.7 89.6 88.7  PLT 257 192 143*   Basic Metabolic Panel: Recent Labs  Lab 08/02/23 2016 08/03/23 0643 08/04/23 0742  NA 139 135 140  K 3.8 3.8 3.6  CL 104 105 107  CO2 21* 24 23  GLUCOSE 147* 146* 94  BUN 10 16 12   CREATININE 1.62* 2.17* 1.44*  CALCIUM 9.2 8.2* 8.4*  MG  --   --  2.0   GFR: Estimated Creatinine Clearance: 60.8 mL/min (A) (by C-G formula based on SCr of 1.44 mg/dL (H)). Liver Function Tests: Recent Labs  Lab 08/02/23 2016 08/03/23 0642 08/03/23 0643 08/04/23 0742  AST 319*  --  174* 92*  ALT 533*  --  337* 236*  ALKPHOS 93  --  77 72  BILITOT 8.8* 7.3* 7.4* 5.7*  PROT 6.7  --  5.4* 5.5*  ALBUMIN 3.8  --  3.0* 2.9*   Coagulation Profile: Recent Labs  Lab 08/03/23 0643  INR 1.5*   HbA1C: Recent Labs    08/03/23 1247  HGBA1C 4.8   Thyroid Function Tests: Recent Labs    08/03/23 0643  TSH 0.378   Sepsis Labs: Recent Labs  Lab 08/03/23 1247 08/03/23 1643  PROCALCITON 14.34  --   LATICACIDVEN 1.4 0.9    Recent Results (from the past 240 hours)  Culture, blood (Routine X 2) w Reflex to ID Panel     Status: None (Preliminary result)   Collection Time: 08/03/23 12:42 PM   Specimen: BLOOD RIGHT ARM  Result Value Ref Range Status   Specimen Description BLOOD RIGHT ARM  Final   Special Requests   Final    BOTTLES DRAWN AEROBIC AND ANAEROBIC Blood Culture results may not be optimal due to an inadequate volume of blood received in culture bottles   Culture   Final    NO GROWTH < 24 HOURS Performed at [facility], [address] [place], [place] [address]    Report Status PENDING  Incomplete  Culture, blood (Routine X 2) w Reflex to ID Panel     Status: None (Preliminary result)   Collection Time: 08/03/23 12:47 PM   Specimen: BLOOD LEFT ARM  Result Value Ref Range Status   Specimen Description BLOOD LEFT ARM  Final   Special Requests   Final    BOTTLES DRAWN AEROBIC AND ANAEROBIC Blood Culture  adequate volume   Culture   Final    NO GROWTH < 24 HOURS Performed at [facility], [address] [place], [place] [address]    Report Status PENDING  Incomplete      Radiology Studies: MR ABDOMEN MRCP W WO CONTAST Result Date: 08/03/2023 CLINICAL DATA:  Jaundice. EXAM: MRI ABDOMEN WITHOUT AND WITH CONTRAST (INCLUDING MRCP) TECHNIQUE: Multiplanar multisequence MR imaging of the abdomen was performed both  before and after the administration of intravenous contrast. Heavily T2-weighted images of the biliary and pancreatic ducts were obtained, and three-dimensional MRCP images were rendered by post processing. CONTRAST:  9.38mL GADAVIST GADOBUTROL 1 MMOL/ML IV SOLN COMPARISON:  CT scan abdomen and pelvis from earlier the same day. FINDINGS: Lower chest: Unremarkable MR appearance to the lung bases. No pleural effusion. No pericardial effusion. Normal heart size. Hepatobiliary: The liver is normal in size and configuration. Noncirrhotic configuration. There is a peripheral wedge-shaped area of arterial hyperenhancement involving the right hepatic lobe, segment 8, which is only seen on the arterial phase images and not seen on any other images, favored to represent transient hepatic intensity difference (THID). This is a nonspecific finding which may be due to arterioportal shunting, portal branch thrombosis, etc. amongst other causes. Please note no discrete portal vein thrombosis is seen, noting postcontrast images are mildly degraded by motion. Mild intrahepatic bile duct dilation. Extrahepatic bile duct is dilated measuring up to 9-10 mm. No choledocholithiasis or obstructing mass seen. Status post cholecystectomy. Pancreas: No mass, inflammatory changes or other parenchymal abnormality identified. No main pancreatic duct dilation. Spleen:  Within normal limits in size and appearance. No focal mass. Adrenals/Urinary Tract: Unremarkable adrenal glands. No hydroureteronephrosis. There is an  incidentally seen 1.2 x 1.5 cm partially exophytic enhancing mass arising from the right kidney upper pole, anteriorly, highly concerning for solid renal neoplasm. There are additional several simple cysts in the left kidney with largest measuring up to 1.3 x 1.6 cm in the left kidney interpolar region, anteriorly. Stomach/Bowel: There is a tiny diverticulum in the second part of duodenum at the level of ampulla of water. Visualized portions within the abdomen are unremarkable. No disproportionate dilation of bowel loops. Unremarkable appendix. Vascular/Lymphatic: No pathologically enlarged lymph nodes identified. No abdominal aortic aneurysm demonstrated. No ascites. Other:  None. Musculoskeletal: No suspicious bone lesions identified. IMPRESSION: 1. There is an incidentally seen 1.2 x 1.5 cm partially exophytic enhancing mass arising from the right kidney upper pole, anteriorly, highly concerning for solid renal neoplasm. 2. There is mild dilation of extrahepatic bile duct measuring up to 9-10 mm. No choledocholithiasis or obstructing mass seen. Findings are nonspecific and most likely due to post cholecystectomy status. 3. Multiple other nonemergent observations, as described above. Electronically Signed   By: Jules Schick M.D.   On: 08/03/2023 17:34   CT ABDOMEN PELVIS W CONTRAST Result Date: 08/03/2023 CLINICAL DATA:  Abdominal pain, nausea, vomiting. EXAM: CT ABDOMEN AND PELVIS WITH CONTRAST TECHNIQUE: Multidetector CT imaging of the abdomen and pelvis was performed using the standard protocol following bolus administration of intravenous contrast. RADIATION DOSE REDUCTION: This exam was performed according to the departmental dose-optimization program which includes automated exposure control, adjustment of the mA and/or kV according to patient size and/or use of iterative reconstruction technique. CONTRAST:  75mL OMNIPAQUE IOHEXOL 350 MG/ML SOLN COMPARISON:  None Available. FINDINGS: Lower chest: No  acute abnormality Hepatobiliary: No focal liver abnormality is seen. Status post cholecystectomy. No biliary dilatation. Pancreas: No focal abnormality or ductal dilatation. Spleen: No focal abnormality.  Normal size. Adrenals/Urinary Tract: No adrenal abnormality. No focal renal abnormality. No stones or hydronephrosis. Urinary bladder is unremarkable. Stomach/Bowel: Normal appendix. Stomach, large and small bowel grossly unremarkable. No bowel obstruction or inflammatory process. Vascular/Lymphatic: No evidence of aneurysm or adenopathy. Reproductive: Prostate enlargement Other: No free fluid or free air. Musculoskeletal: No acute bony abnormality. IMPRESSION: No acute findings in the abdomen or pelvis. Electronically Signed   By:  Charlett Nose M.D.   On: 08/03/2023 00:57     Scheduled Meds:  heparin  5,000 Units Subcutaneous Q8H   hydrocortisone   Rectal BID   ondansetron  4 mg Oral Once   pantoprazole  20 mg Oral Daily   polyethylene glycol  17 g Oral Daily   senna-docusate  1 tablet Oral BID   sodium chloride flush  3 mL Intravenous Q12H   Continuous Infusions:  cefTRIAXone (ROCEPHIN)  IV Stopped (08/04/23 1225)   metronidazole 500 mg (08/04/23 1330)     LOS: 1 day    Time spent: 35 minutes   Sharlene Dory, DO [facility] 08/04/2023, 2:25 PM   Available via Epic secure chat 7am-7pm After these hours, please refer to coverage provider listed on [email or web address]

## 2023-08-04 NOTE — Progress Notes (Signed)
 Progress Note   Subjective  Chief Complaint:  Abdominal pain, nausea, vomiting, elevated transaminases, hyperbilirubinemia and leukocytosis   -- No acute events overnight -- Reports feeling better today with diminished abdominal discomfort -- No nausea or vomiting, remains afebrile -- LFTs improving total bili 5.7/alk phos 72, AST 92, ALT 236    Objective   Vital signs in last 24 hours: Temp:  [97.5 F (36.4 C)-98.5 F (36.9 C)] 98.5 F (36.9 C) (03/16 0749) Pulse Rate:  [69-91] 70 (03/16 0749) Resp:  [18-19] 18 (03/16 0749) BP: (101-131)/(60-71) 101/65 (03/16 0749) SpO2:  [95 %-100 %] 96 % (03/16 0749) Last BM Date : 08/03/23 General:    Mild jaundice, resting quietly in bed in no distress Heart:  Regular rate and rhythm; no murmurs Lungs: Respirations even and unlabored, lungs CTA bilaterally Abdomen:  Soft, nontender and nondistended. Normal bowel sounds. Extremities:  Without edema. Neurologic:  Alert and oriented,  grossly normal neurologically. Psych:  Cooperative. Normal mood and affect.  Intake/Output from previous day: 03/15 0701 - 03/16 0700 In: 828.7 [P.O.:240; I.V.:588.7] Out: 1580 [Urine:1580] Intake/Output this shift: Total I/O In: 415.8 [IV Piggyback:415.8] Out: 150 [Urine:150]  Lab Results: Recent Labs    08/02/23 2016 08/03/23 0643 08/04/23 0742  WBC 18.8* 21.0* 7.1  HGB 16.0 13.4 13.4  HCT 46.3 38.9* 38.4*  PLT 257 192 143*   BMET Recent Labs    08/02/23 2016 08/03/23 0643 08/04/23 0742  NA 139 135 140  K 3.8 3.8 3.6  CL 104 105 107  CO2 21* 24 23  GLUCOSE 147* 146* 94  BUN 10 16 12   CREATININE 1.62* 2.17* 1.44*  CALCIUM 9.2 8.2* 8.4*   LFT Recent Labs    08/03/23 0642 08/03/23 0643 08/04/23 0742  PROT  --    < > 5.5*  ALBUMIN  --    < > 2.9*  AST  --    < > 92*  ALT  --    < > 236*  ALKPHOS  --    < > 72  BILITOT 7.3*   < > 5.7*  BILIDIR 4.6*  --   --   IBILI 2.7*  --   --    < > = values in this interval not  displayed.   PT/INR Recent Labs    08/03/23 0643  LABPROT 18.7*  INR 1.5*   Acute hepatitis panel negative Anti-smooth muscle antibody IgG negative Acetaminophen level less than 10 TSH normal  Blood cultures no growth to date  Studies/Results: MR ABDOMEN MRCP W WO CONTAST Result Date: 08/03/2023 CLINICAL DATA:  Jaundice. EXAM: MRI ABDOMEN WITHOUT AND WITH CONTRAST (INCLUDING MRCP) TECHNIQUE: Multiplanar multisequence MR imaging of the abdomen was performed both before and after the administration of intravenous contrast. Heavily T2-weighted images of the biliary and pancreatic ducts were obtained, and three-dimensional MRCP images were rendered by post processing. CONTRAST:  9.70mL GADAVIST GADOBUTROL 1 MMOL/ML IV SOLN COMPARISON:  CT scan abdomen and pelvis from earlier the same day. FINDINGS: Lower chest: Unremarkable MR appearance to the lung bases. No pleural effusion. No pericardial effusion. Normal heart size. Hepatobiliary: The liver is normal in size and configuration. Noncirrhotic configuration. There is a peripheral wedge-shaped area of arterial hyperenhancement involving the right hepatic lobe, segment 8, which is only seen on the arterial phase images and not seen on any other images, favored to represent transient hepatic intensity difference (THID). This is a nonspecific finding which may be due to arterioportal shunting, portal branch  thrombosis, etc. amongst other causes. Please note no discrete portal vein thrombosis is seen, noting postcontrast images are mildly degraded by motion. Mild intrahepatic bile duct dilation. Extrahepatic bile duct is dilated measuring up to 9-10 mm. No choledocholithiasis or obstructing mass seen. Status post cholecystectomy. Pancreas: No mass, inflammatory changes or other parenchymal abnormality identified. No main pancreatic duct dilation. Spleen:  Within normal limits in size and appearance. No focal mass. Adrenals/Urinary Tract: Unremarkable adrenal  glands. No hydroureteronephrosis. There is an incidentally seen 1.2 x 1.5 cm partially exophytic enhancing mass arising from the right kidney upper pole, anteriorly, highly concerning for solid renal neoplasm. There are additional several simple cysts in the left kidney with largest measuring up to 1.3 x 1.6 cm in the left kidney interpolar region, anteriorly. Stomach/Bowel: There is a tiny diverticulum in the second part of duodenum at the level of ampulla of water. Visualized portions within the abdomen are unremarkable. No disproportionate dilation of bowel loops. Unremarkable appendix. Vascular/Lymphatic: No pathologically enlarged lymph nodes identified. No abdominal aortic aneurysm demonstrated. No ascites. Other:  None. Musculoskeletal: No suspicious bone lesions identified. IMPRESSION: 1. There is an incidentally seen 1.2 x 1.5 cm partially exophytic enhancing mass arising from the right kidney upper pole, anteriorly, highly concerning for solid renal neoplasm. 2. There is mild dilation of extrahepatic bile duct measuring up to 9-10 mm. No choledocholithiasis or obstructing mass seen. Findings are nonspecific and most likely due to post cholecystectomy status. 3. Multiple other nonemergent observations, as described above. Electronically Signed   By: Jules Schick M.D.   On: 08/03/2023 17:34   CT ABDOMEN PELVIS W CONTRAST Result Date: 08/03/2023 CLINICAL DATA:  Abdominal pain, nausea, vomiting. EXAM: CT ABDOMEN AND PELVIS WITH CONTRAST TECHNIQUE: Multidetector CT imaging of the abdomen and pelvis was performed using the standard protocol following bolus administration of intravenous contrast. RADIATION DOSE REDUCTION: This exam was performed according to the departmental dose-optimization program which includes automated exposure control, adjustment of the mA and/or kV according to patient size and/or use of iterative reconstruction technique. CONTRAST:  75mL OMNIPAQUE IOHEXOL 350 MG/ML SOLN COMPARISON:   None Available. FINDINGS: Lower chest: No acute abnormality Hepatobiliary: No focal liver abnormality is seen. Status post cholecystectomy. No biliary dilatation. Pancreas: No focal abnormality or ductal dilatation. Spleen: No focal abnormality.  Normal size. Adrenals/Urinary Tract: No adrenal abnormality. No focal renal abnormality. No stones or hydronephrosis. Urinary bladder is unremarkable. Stomach/Bowel: Normal appendix. Stomach, large and small bowel grossly unremarkable. No bowel obstruction or inflammatory process. Vascular/Lymphatic: No evidence of aneurysm or adenopathy. Reproductive: Prostate enlargement Other: No free fluid or free air. Musculoskeletal: No acute bony abnormality. IMPRESSION: No acute findings in the abdomen or pelvis. Electronically Signed   By: Charlett Nose M.D.   On: 08/03/2023 00:57    PREVIOUS ENDOSCOPIES:            Colonoscopy 08/2022   Colonoscopy 06/2011     Assessment / Plan:   63 yo with a past medical history noteworthy for HTN, arthritis, anxiety and cholecystectomy 04/2023 who is seen in consultation for abdominal pain, nausea, vomiting, scleral icterus and elevated LFTs/direct hyperbilirubinemia and leukocytosis. Constellation of findings concerning for possible retained stone or sludge status post cholecystectomy.  A form of acute hepatitis may be in the differential diagnosis although less likely.-acute hepatitis panel and acetaminophen level normal, anti-smooth muscle antibody normal.  MRCP with mild dilation of extrahepatic bile duct measuring 9 to 10 mm without choledocholithiasis or obstructing mass.  Imaging reviewed  with Dr. Marina Goodell -no ERCP indicated at this time based upon imaging and improving LFTs.  May have had biliary sludge contributing to elevated liver enzymes.  MRCP also shows transient hepatic intensity difference -nonspecific finding and no evidence of portal vein thrombosis or vascular anomalies seen around the liver.   Incidental notation  was made on MRI of an exophytic renal mass with follow-up recommended  He is overall clinically stable today and remains afebrile.  Leukocytosis has resolved while on antibiotics.  No clinical symptoms of cholangitis.   Mr. Burruss has had a history of mild constipation.  Reasonable to continue MiraLAX and senna.  Recommendations: Continue to trend daily CBC and CMP No plan for ERCP at this time unless there is recurrent increase in LFTs Continue Rocephin and Flagyl the setting of leukocytosis Monitor fever curve Continue Protonix 20 mg orally daily Continue MiraLAX 1 cap daily in conjunction with senna twice daily Notify GI team if signs or symptoms of cholangitis evolve  Dr. Nicholes Mango will assume rounding on Mr. Trawick on 08/05/2023       LOS: 1 day   Durene Romans Payne Garske  08/04/2023, 2:51 PM

## 2023-08-04 NOTE — Plan of Care (Signed)
   Problem: Education: Goal: Knowledge of General Education information will improve Description Including pain rating scale, medication(s)/side effects and non-pharmacologic comfort measures Outcome: Progressing   Problem: Health Behavior/Discharge Planning: Goal: Ability to manage health-related needs will improve Outcome: Progressing

## 2023-08-05 LAB — CBC
HCT: 37.7 % — ABNORMAL LOW (ref 39.0–52.0)
Hemoglobin: 12.8 g/dL — ABNORMAL LOW (ref 13.0–17.0)
MCH: 30.5 pg (ref 26.0–34.0)
MCHC: 34 g/dL (ref 30.0–36.0)
MCV: 90 fL (ref 80.0–100.0)
Platelets: 152 10*3/uL (ref 150–400)
RBC: 4.19 MIL/uL — ABNORMAL LOW (ref 4.22–5.81)
RDW: 13.3 % (ref 11.5–15.5)
WBC: 6.1 10*3/uL (ref 4.0–10.5)
nRBC: 0 % (ref 0.0–0.2)

## 2023-08-05 LAB — COMPREHENSIVE METABOLIC PANEL
ALT: 168 U/L — ABNORMAL HIGH (ref 0–44)
AST: 48 U/L — ABNORMAL HIGH (ref 15–41)
Albumin: 2.6 g/dL — ABNORMAL LOW (ref 3.5–5.0)
Alkaline Phosphatase: 59 U/L (ref 38–126)
Anion gap: 9 (ref 5–15)
BUN: 7 mg/dL — ABNORMAL LOW (ref 8–23)
CO2: 22 mmol/L (ref 22–32)
Calcium: 8.2 mg/dL — ABNORMAL LOW (ref 8.9–10.3)
Chloride: 106 mmol/L (ref 98–111)
Creatinine, Ser: 1.13 mg/dL (ref 0.61–1.24)
GFR, Estimated: >60 mL/min (ref >60)
Glucose, Bld: 83 mg/dL (ref 70–99)
Potassium: 3.6 mmol/L (ref 3.5–5.1)
Sodium: 137 mmol/L (ref 135–145)
Total Bilirubin: 3.6 mg/dL — ABNORMAL HIGH (ref 0.0–1.2)
Total Protein: 5.4 g/dL — ABNORMAL LOW (ref 6.5–8.1)

## 2023-08-05 MED ORDER — TAMSULOSIN HCL 0.4 MG PO CAPS
0.4000 mg | ORAL_CAPSULE | Freq: Every day | ORAL | Status: DC
  Administered 2023-08-05: 0.4 mg via ORAL
  Filled 2023-08-05: qty 1

## 2023-08-05 NOTE — Progress Notes (Signed)
 PROGRESS NOTE    Joel Morales  NWG:[id number] DOB: 1960-10-22 DOA: 08/02/2023 PCP: Eustaquio Boyden, MD     Brief Narrative:  63 year old man with PMH of cholecystitis s/p cholecystectomy in 04/2023, HTN, GERD, BPH, hemorrhoid and insomnia who presented with abdominal pain with associated nausea, vomiting and poor oral tolerance for 1 day, and admitted with hyperbilirubinemia/transaminitis/jaundice. Reportedly had a normal colonoscopy about a year ago.  Followed by Dr. Elnoria Howard.   In ED, slightly hypotensive to 87/66.  HR 116.  RR 27.  WBC 18.8 with left shift.  AST 319.  ALT 553.  Total bili 8.8. Cr 1.62 (baseline 1.1-1.2).  Tylenol and EtOH level negative.  Lipase normal.  CT abdomen and pelvis without acute finding.  Acute hepatitis panel negative.  New events last 24 hours / Subjective: Patient states he is feeling much better. He has some nausea but is not vomiting.  He has no abdominal pain. Advanced to full liquid diet.  Assessment & Plan:    Transaminitis, hyperbilirubinemia Presented with abdominal pain, nausea, vomiting. Patient is s/p cholecystectomy in December 2024. Unclear etiology of transaminitis at this time. Hepatitis panel negative.  MRCP showed mild dilatation of the extrahepatic bile duct without choledocholithiasis or obstructing mass. Gastroenterology following. Bilirubin and transaminases trending down. -GI recommended to continue to trend CBC and CMP.  No plan for ERCP unless there is a recurrent increase in LFTs. -Continue Rocephin, Flagyl. -Continue Protonix. -Notify GI team if signs or symptoms of cholangitis evolve    Acute kidney injury superimposed on chronic kidney disease (HCC)  Baseline creatinine is 1.1 Patient presented with creatinine of 1.62 and it peaked at 2.17 Likely prerenal due to poor PO intake.  AKI has resolved.  - Avoid nephrotoxins.  - encourage PO fluid intake.    Renal Mass  Incidentally noted on MRCP  Will require  outpatient urology follow-up    Essential hypertension  Hold home losartan     History of hemorrhoids  Anusol twice daily    BPH (benign prostatic hyperplasia)  Resume home Flomax    GERD (gastroesophageal reflux disease)  Protonix 20 mg daily    Insomnia  Melatonin    Constipation  Senna, MiraLAX   DVT prophylaxis: Heparin Code Status: Full Family Communication: Spouse at bedside Coming From: Home Disposition Plan: Hopefully home Barriers to Discharge: Clinical improvement/workup  Consultants:  GI   Antimicrobials:  Anti-infectives (From admission, onward)    Start     Dose/Rate Route Frequency Ordered Stop   08/03/23 1100  cefTRIAXone (ROCEPHIN) 2 g in sodium chloride 0.9 % 100 mL IVPB        2 g 200 mL/hr over 30 Minutes Intravenous Every 24 hours 08/03/23 1009     08/03/23 1100  metroNIDAZOLE (FLAGYL) IVPB 500 mg        500 mg 100 mL/hr over 60 Minutes Intravenous Every 12 hours 08/03/23 1009          Objective: Vitals:   08/04/23 1950 08/05/23 0429 08/05/23 0751 08/05/23 1537  BP: 122/71 130/69 (!) 141/75 123/68  Pulse: 87 (!) 59 67 64  Resp: 19 18    Temp: 98.8 F (37.1 C) 98.8 F (37.1 C) 98.5 F (36.9 C) 98 F (36.7 C)  TempSrc: Oral Oral Oral Oral  SpO2: 99% 98% 97% 97%  Weight:      Height:        Intake/Output Summary (Last 24 hours) at 08/05/2023 1627 Last data filed at 08/05/2023 1008 Gross per 24  hour  Intake 920 ml  Output 250 ml  Net 670 ml   Filed Weights   08/02/23 2006  Weight: 95.3 kg   Physical examination  General: Alert, oriented X3  Eyes: Pupils equal, reactive  Oral cavity: moist mucous membranes  Head: Atraumatic, normocephalic  Neck: supple  Chest: clear to auscultation. No crackles, no wheezes  CVS: S1,S2 RRR. No murmurs  Abd: No distention, soft, non-tender. No masses palpable  Extr: No edema   MSK: No joint deformities or swelling  Neurological: Grossly intact.    Data Reviewed: I have personally  reviewed following labs and imaging studies  CBC: Recent Labs  Lab 08/02/23 2016 08/03/23 0643 08/04/23 0742 08/05/23 0620  WBC 18.8* 21.0* 7.1 6.1  NEUTROABS 17.3*  --  4.7  --   HGB 16.0 13.4 13.4 12.8*  HCT 46.3 38.9* 38.4* 37.7*  MCV 89.7 89.6 88.7 90.0  PLT 257 192 143* 152   Basic Metabolic Panel: Recent Labs  Lab 08/02/23 2016 08/03/23 0643 08/04/23 0742 08/05/23 0620  NA 139 135 140 137  K 3.8 3.8 3.6 3.6  CL 104 105 107 106  CO2 21* 24 23 22   GLUCOSE 147* 146* 94 83  BUN 10 16 12  7*  CREATININE 1.62* 2.17* 1.44* 1.13  CALCIUM 9.2 8.2* 8.4* 8.2*  MG  --   --  2.0  --    GFR: Estimated Creatinine Clearance: 77.5 mL/min (by C-G formula based on SCr of 1.13 mg/dL). Liver Function Tests: Recent Labs  Lab 08/02/23 2016 08/03/23 0642 08/03/23 0643 08/04/23 0742 08/05/23 0620  AST 319*  --  174* 92* 48*  ALT 533*  --  337* 236* 168*  ALKPHOS 93  --  77 72 59  BILITOT 8.8* 7.3* 7.4* 5.7* 3.6*  PROT 6.7  --  5.4* 5.5* 5.4*  ALBUMIN 3.8  --  3.0* 2.9* 2.6*   Coagulation Profile: Recent Labs  Lab 08/03/23 0643  INR 1.5*   HbA1C: Recent Labs    08/03/23 1247  HGBA1C 4.8   Thyroid Function Tests: Recent Labs    08/03/23 0643  TSH 0.378   Sepsis Labs: Recent Labs  Lab 08/03/23 1247 08/03/23 1643  PROCALCITON 14.34  --   LATICACIDVEN 1.4 0.9    Recent Results (from the past 240 hours)  Culture, blood (Routine X 2) w Reflex to ID Panel     Status: None (Preliminary result)   Collection Time: 08/03/23 12:42 PM   Specimen: BLOOD RIGHT ARM  Result Value Ref Range Status   Specimen Description BLOOD RIGHT ARM  Final   Special Requests   Final    BOTTLES DRAWN AEROBIC AND ANAEROBIC Blood Culture results may not be optimal due to an inadequate volume of blood received in culture bottles   Culture   Final    NO GROWTH 2 DAYS Performed at [facility], [address], [place], [place] [address]    Report Status PENDING  Incomplete   Culture, blood (Routine X 2) w Reflex to ID Panel     Status: None (Preliminary result)   Collection Time: 08/03/23 12:47 PM   Specimen: BLOOD LEFT ARM  Result Value Ref Range Status   Specimen Description BLOOD LEFT ARM  Final   Special Requests   Final    BOTTLES DRAWN AEROBIC AND ANAEROBIC Blood Culture adequate volume   Culture   Final    NO GROWTH 2 DAYS Performed at [facility], [address], [place], [place] [address]    Report Status PENDING  Incomplete      Radiology Studies: No results found.    Scheduled Meds:  heparin  5,000 Units Subcutaneous Q8H   hydrocortisone   Rectal BID   ondansetron  4 mg Oral Once   pantoprazole  20 mg Oral Daily   polyethylene glycol  17 g Oral Daily   senna-docusate  1 tablet Oral BID   sodium chloride flush  3 mL Intravenous Q12H   Continuous Infusions:  cefTRIAXone (ROCEPHIN)  IV 2 g (08/05/23 1012)   metronidazole 500 mg (08/05/23 1209)     LOS: 2 days    Time spent: 35 minutes   MDALA-GAUSI, Gwenette Greet, MD  [facility] 08/05/2023, 4:27 PM   Available via Epic secure chat 7am-7pm After these hours, please refer to coverage provider listed on [email or web address]

## 2023-08-05 NOTE — Plan of Care (Signed)

## 2023-08-05 NOTE — Plan of Care (Signed)
  Problem: Education: Goal: Knowledge of General Education information will improve Description: Including pain rating scale, medication(s)/side effects and non-pharmacologic comfort measures Outcome: Progressing   Problem: Health Behavior/Discharge Planning: Goal: Ability to manage health-related needs will improve Outcome: Progressing   Problem: Nutrition: Goal: Adequate nutrition will be maintained Outcome: Progressing   Problem: Elimination: Goal: Will not experience complications related to urinary retention Outcome: Progressing   Problem: Pain Managment: Goal: General experience of comfort will improve and/or be controlled Outcome: Progressing

## 2023-08-06 LAB — CBC
HCT: 37.7 % — ABNORMAL LOW (ref 39.0–52.0)
Hemoglobin: 13.1 g/dL (ref 13.0–17.0)
MCH: 30.5 pg (ref 26.0–34.0)
MCHC: 34.7 g/dL (ref 30.0–36.0)
MCV: 87.9 fL (ref 80.0–100.0)
Platelets: 170 10*3/uL (ref 150–400)
RBC: 4.29 MIL/uL (ref 4.22–5.81)
RDW: 13.2 % (ref 11.5–15.5)
WBC: 5.4 10*3/uL (ref 4.0–10.5)
nRBC: 0 % (ref 0.0–0.2)

## 2023-08-06 LAB — COMPREHENSIVE METABOLIC PANEL
ALT: 127 U/L — ABNORMAL HIGH (ref 0–44)
AST: 36 U/L (ref 15–41)
Albumin: 2.7 g/dL — ABNORMAL LOW (ref 3.5–5.0)
Alkaline Phosphatase: 62 U/L (ref 38–126)
Anion gap: 8 (ref 5–15)
BUN: 10 mg/dL (ref 8–23)
CO2: 22 mmol/L (ref 22–32)
Calcium: 8.3 mg/dL — ABNORMAL LOW (ref 8.9–10.3)
Chloride: 108 mmol/L (ref 98–111)
Creatinine, Ser: 1.02 mg/dL (ref 0.61–1.24)
GFR, Estimated: >60 mL/min (ref >60)
Glucose, Bld: 95 mg/dL (ref 70–99)
Potassium: 3.4 mmol/L — ABNORMAL LOW (ref 3.5–5.1)
Sodium: 138 mmol/L (ref 135–145)
Total Bilirubin: 2.8 mg/dL — ABNORMAL HIGH (ref 0.0–1.2)
Total Protein: 5.5 g/dL — ABNORMAL LOW (ref 6.5–8.1)

## 2023-08-06 MED ORDER — SENNOSIDES-DOCUSATE SODIUM 8.6-50 MG PO TABS: 1.0000 | ORAL_TABLET | Freq: Two times a day (BID) | ORAL | 0 refills | Status: DC

## 2023-08-06 MED ORDER — PANTOPRAZOLE SODIUM 20 MG PO TBEC: 20.0000 mg | DELAYED_RELEASE_TABLET | Freq: Every day | ORAL | 0 refills | Status: DC

## 2023-08-06 NOTE — Plan of Care (Signed)

## 2023-08-06 NOTE — Discharge Summary (Signed)
 Physician Discharge Summary   Patient: Joel Morales MRN: [id number] DOB: 1961/01/07  Admit date:     08/02/2023  Discharge date: 08/06/23  Discharge Physician: MDALA-GAUSI, Gwenette Greet   PCP: Eustaquio Boyden, MD   Recommendations at discharge:   Follow up with Urology.   Discharge Diagnoses: Principal Problem:   Transaminitis Active Problems:   Hypotension   Nausea and vomiting   Abdominal pain   Hyperbilirubinemia   Acute kidney injury superimposed on chronic kidney disease (HCC)   Essential hypertension   History of cholecystectomy   History of hemorrhoids   BPH (benign prostatic hyperplasia)   GERD (gastroesophageal reflux disease)   Insomnia   Constipation   Renal mass, right  Resolved Problems:   * No resolved hospital problems. [facility] Course:  63 year old man with PMH of cholecystitis s/p cholecystectomy in 04/2023, HTN, GERD, BPH, hemorrhoid and insomnia who presented with abdominal pain with associated nausea, vomiting and poor oral tolerance for 1 day, and admitted with hyperbilirubinemia/transaminitis/jaundice. Reportedly had a normal colonoscopy about a year ago.  Followed by Dr. Elnoria Howard.   In ED, slightly hypotensive to 87/66.  HR 116.  RR 27.  WBC 18.8 with left shift.  AST 319.  ALT 553.  Total bili 8.8. Cr 1.62 (baseline 1.1-1.2).  Tylenol and EtOH level negative.  Lipase normal.  CT abdomen and pelvis without acute finding.  Acute hepatitis panel negative.  The hospital course is in problem-based format below:    Transaminitis, hyperbilirubinemia Presented with abdominal pain, nausea, vomiting. Patient is s/p cholecystectomy in December 2024. Unclear etiology of transaminitis. Hepatitis panel negative.  MRCP showed mild dilatation of the extrahepatic bile duct without choledocholithiasis or obstructing mass. Gastroenterology was consulted.  Bilirubin and transaminases trended down. GI recommended to continue to trend CBC and CMP.  No plan  for ERCP unless there was a recurrent increase in LFTs. Rocephin and Flagyl were continued and were discontinued at discharge due to no convincing evidence for infectious process. Protonix was continued. Diet was slowly advanced up to a soft diet at the time of discharge.  Bilirubin and transaminases continued to trend down up to the day of discharge. Patient was discharged home in stable condition.   Acute kidney injury superimposed on chronic kidney disease (HCC) Baseline creatinine is 1.1 Patient presented with creatinine of 1.62 and it peaked at 2.17 Likely prerenal due to poor PO intake.  AKI has resolved.  Creatinine at discharge was 1.02.Marland Kitchen     Renal Mass  Incidentally noted on MRCP Ambulatory referral to urology was made.    Essential hypertension Home losartan was held and resumed at discharge.      BPH (benign prostatic hyperplasia) Continued home Flomax   Consultants: Gastroenterology Procedures performed: n/a  Disposition: Home Diet recommendation:  Discharge Diet Orders (From admission, onward)     Start     Ordered   08/06/23 0000  Diet - low sodium heart healthy        08/06/23 1201           Regular diet DISCHARGE MEDICATION: Allergies as of 08/06/2023       Reactions   Chicken Allergy Nausea And Vomiting   No chicken or chicken broth         Medication List     TAKE these medications    acetaminophen 500 MG tablet Commonly known as: TYLENOL Take 2 tablets (1,000 mg total) by mouth every 6 (six) hours. What changed:  how much to take  when to take this reasons to take this   diphenhydrAMINE 25 MG tablet Commonly known as: BENADRYL Take 25 mg by mouth every 6 (six) hours as needed for sleep.   fluticasone 50 MCG/ACT nasal spray Commonly known as: FLONASE Place daily into both nostrils.   levocetirizine 5 MG tablet Commonly known as: XYZAL Take 5 mg by mouth every evening.   losartan 50 MG tablet Commonly known as: Cozaar Take 1  tablet (50 mg total) by mouth daily.   Melatonin 10 MG Tabs Take 10 mg by mouth at bedtime as needed (sleep).   multivitamin tablet Take 1 tablet by mouth daily.   ondansetron 4 MG disintegrating tablet Commonly known as: ZOFRAN-ODT Take 1 tablet (4 mg total) by mouth every 8 (eight) hours as needed.   pantoprazole 20 MG tablet Commonly known as: PROTONIX Take 1 tablet (20 mg total) by mouth daily.   propranolol 40 MG tablet Commonly known as: INDERAL Take 0.5 tablets (20 mg total) by mouth 2 (two) times daily.   senna-docusate 8.6-50 MG tablet Commonly known as: Senokot-S Take 1 tablet by mouth 2 (two) times daily.   tamsulosin 0.4 MG Caps capsule Commonly known as: FLOMAX Take 1 capsule (0.4 mg total) by mouth daily after supper.        Discharge Exam: Filed Weights   08/02/23 2006  Weight: 95.3 kg   Physical Exam on Day of Discharge   General: Alert, cheerful, oriented X3  Oral cavity: moist mucous membranes  Neck: supple  Chest: clear to auscultation. No crackles, no wheezes  CVS: S1,S2 RRR. No murmurs  Abd: No distention, soft, non-tender. No masses palpable  Extr: No edema    Condition at discharge: good  The results of significant diagnostics from this hospitalization (including imaging, microbiology, ancillary and laboratory) are listed below for reference.   Imaging Studies: MR ABDOMEN MRCP W WO CONTAST Result Date: 08/03/2023 CLINICAL DATA:  Jaundice. EXAM: MRI ABDOMEN WITHOUT AND WITH CONTRAST (INCLUDING MRCP) TECHNIQUE: Multiplanar multisequence MR imaging of the abdomen was performed both before and after the administration of intravenous contrast. Heavily T2-weighted images of the biliary and pancreatic ducts were obtained, and three-dimensional MRCP images were rendered by post processing. CONTRAST:  9.22mL GADAVIST GADOBUTROL 1 MMOL/ML IV SOLN COMPARISON:  CT scan abdomen and pelvis from earlier the same day. FINDINGS: Lower chest: Unremarkable MR  appearance to the lung bases. No pleural effusion. No pericardial effusion. Normal heart size. Hepatobiliary: The liver is normal in size and configuration. Noncirrhotic configuration. There is a peripheral wedge-shaped area of arterial hyperenhancement involving the right hepatic lobe, segment 8, which is only seen on the arterial phase images and not seen on any other images, favored to represent transient hepatic intensity difference (THID). This is a nonspecific finding which may be due to arterioportal shunting, portal branch thrombosis, etc. amongst other causes. Please note no discrete portal vein thrombosis is seen, noting postcontrast images are mildly degraded by motion. Mild intrahepatic bile duct dilation. Extrahepatic bile duct is dilated measuring up to 9-10 mm. No choledocholithiasis or obstructing mass seen. Status post cholecystectomy. Pancreas: No mass, inflammatory changes or other parenchymal abnormality identified. No main pancreatic duct dilation. Spleen:  Within normal limits in size and appearance. No focal mass. Adrenals/Urinary Tract: Unremarkable adrenal glands. No hydroureteronephrosis. There is an incidentally seen 1.2 x 1.5 cm partially exophytic enhancing mass arising from the right kidney upper pole, anteriorly, highly concerning for solid renal neoplasm. There are additional several simple cysts in  the left kidney with largest measuring up to 1.3 x 1.6 cm in the left kidney interpolar region, anteriorly. Stomach/Bowel: There is a tiny diverticulum in the second part of duodenum at the level of ampulla of water. Visualized portions within the abdomen are unremarkable. No disproportionate dilation of bowel loops. Unremarkable appendix. Vascular/Lymphatic: No pathologically enlarged lymph nodes identified. No abdominal aortic aneurysm demonstrated. No ascites. Other:  None. Musculoskeletal: No suspicious bone lesions identified. IMPRESSION: 1. There is an incidentally seen 1.2 x 1.5 cm  partially exophytic enhancing mass arising from the right kidney upper pole, anteriorly, highly concerning for solid renal neoplasm. 2. There is mild dilation of extrahepatic bile duct measuring up to 9-10 mm. No choledocholithiasis or obstructing mass seen. Findings are nonspecific and most likely due to post cholecystectomy status. 3. Multiple other nonemergent observations, as described above. Electronically Signed   By: Jules Schick M.D.   On: 08/03/2023 17:34   CT ABDOMEN PELVIS W CONTRAST Result Date: 08/03/2023 CLINICAL DATA:  Abdominal pain, nausea, vomiting. EXAM: CT ABDOMEN AND PELVIS WITH CONTRAST TECHNIQUE: Multidetector CT imaging of the abdomen and pelvis was performed using the standard protocol following bolus administration of intravenous contrast. RADIATION DOSE REDUCTION: This exam was performed according to the departmental dose-optimization program which includes automated exposure control, adjustment of the mA and/or kV according to patient size and/or use of iterative reconstruction technique. CONTRAST:  75mL OMNIPAQUE IOHEXOL 350 MG/ML SOLN COMPARISON:  None Available. FINDINGS: Lower chest: No acute abnormality Hepatobiliary: No focal liver abnormality is seen. Status post cholecystectomy. No biliary dilatation. Pancreas: No focal abnormality or ductal dilatation. Spleen: No focal abnormality.  Normal size. Adrenals/Urinary Tract: No adrenal abnormality. No focal renal abnormality. No stones or hydronephrosis. Urinary bladder is unremarkable. Stomach/Bowel: Normal appendix. Stomach, large and small bowel grossly unremarkable. No bowel obstruction or inflammatory process. Vascular/Lymphatic: No evidence of aneurysm or adenopathy. Reproductive: Prostate enlargement Other: No free fluid or free air. Musculoskeletal: No acute bony abnormality. IMPRESSION: No acute findings in the abdomen or pelvis. Electronically Signed   By: Charlett Nose M.D.   On: 08/03/2023 00:57     Microbiology: Results for orders placed or performed during the hospital encounter of 08/02/23  Culture, blood (Routine X 2) w Reflex to ID Panel     Status: None (Preliminary result)   Collection Time: 08/03/23 12:42 PM   Specimen: BLOOD RIGHT ARM  Result Value Ref Range Status   Specimen Description BLOOD RIGHT ARM  Final   Special Requests   Final    BOTTLES DRAWN AEROBIC AND ANAEROBIC Blood Culture results may not be optimal due to an inadequate volume of blood received in culture bottles   Culture   Final    NO GROWTH 3 DAYS Performed at [facility], [address] [place], [place] [address]    Report Status PENDING  Incomplete  Culture, blood (Routine X 2) w Reflex to ID Panel     Status: None (Preliminary result)   Collection Time: 08/03/23 12:47 PM   Specimen: BLOOD LEFT ARM  Result Value Ref Range Status   Specimen Description BLOOD LEFT ARM  Final   Special Requests   Final    BOTTLES DRAWN AEROBIC AND ANAEROBIC Blood Culture adequate volume   Culture   Final    NO GROWTH 3 DAYS Performed at [facility], [address] [place], [place] [address]    Report Status PENDING  Incomplete    Labs: CBC: Recent Labs  Lab 08/02/23 2016 08/03/23 0643 08/04/23 0742 08/05/23 0620 08/06/23 0552  WBC 18.8* 21.0* 7.1 6.1 5.4  NEUTROABS 17.3*  --  4.7  --   --   HGB 16.0 13.4 13.4 12.8* 13.1  HCT 46.3 38.9* 38.4* 37.7* 37.7*  MCV 89.7 89.6 88.7 90.0 87.9  PLT 257 192 143* 152 170   Basic Metabolic Panel: Recent Labs  Lab 08/02/23 2016 08/03/23 0643 08/04/23 0742 08/05/23 0620 08/06/23 0552  NA 139 135 140 137 138  K 3.8 3.8 3.6 3.6 3.4*  CL 104 105 107 106 108  CO2 21* 24 23 22 22   GLUCOSE 147* 146* 94 83 95  BUN 10 16 12  7* 10  CREATININE 1.62* 2.17* 1.44* 1.13 1.02  CALCIUM 9.2 8.2* 8.4* 8.2* 8.3*  MG  --   --  2.0  --   --    Liver Function Tests: Recent Labs  Lab 08/02/23 2016 08/03/23 0642 08/03/23 0643 08/04/23 0742  08/05/23 0620 08/06/23 0552  AST 319*  --  174* 92* 48* 36  ALT 533*  --  337* 236* 168* 127*  ALKPHOS 93  --  77 72 59 62  BILITOT 8.8* 7.3* 7.4* 5.7* 3.6* 2.8*  PROT 6.7  --  5.4* 5.5* 5.4* 5.5*  ALBUMIN 3.8  --  3.0* 2.9* 2.6* 2.7*   CBG: No results for input(s): "GLUCAP" in the last 168 hours.  Discharge time spent: greater than 30 minutes.  Signed: MDALA-GAUSI, Gwenette Greet, MD [facility] 08/06/2023

## 2023-08-06 NOTE — Progress Notes (Signed)
 Explained discharge instructions to patient. Reviewed follow up appointment and next medication administration times. Also reviewed education. Patient verbalized having an understanding for instructions given. All belongings are in the patient's possession. IV and telemetry were removed. CCMD was notified. No other needs verbalized. Will transport downstairs for discharge.

## 2023-08-08 ENCOUNTER — Other Ambulatory Visit (HOSPITAL_COMMUNITY): Payer: Self-pay | Admitting: Student

## 2023-08-08 ENCOUNTER — Telehealth: Payer: Self-pay | Admitting: Family Medicine

## 2023-08-08 ENCOUNTER — Encounter: Payer: Self-pay | Admitting: Family Medicine

## 2023-08-08 DIAGNOSIS — R109 Unspecified abdominal pain: Secondary | ICD-10-CM

## 2023-08-08 LAB — CULTURE, BLOOD (ROUTINE X 2)
Culture: NO GROWTH
Culture: NO GROWTH
Special Requests: ADEQUATE

## 2023-08-08 NOTE — Telephone Encounter (Signed)
 Copied from CRM [id number]. Topic: General - Other >> Aug 08, 2023  1:45 PM [place] E wrote: Reason for CRM: Patient called stating that he has a return-to-work note dated for March 24th, but he stated he has to get seen for a hospital follow-up before he can return to work. 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 Callback number for patient is [phone] to discuss.

## 2023-08-09 NOTE — Telephone Encounter (Addendum)
 Please reschedule hosp f/u visit for Mon 08/12/2023 at 2pm instead.  We can do requested paperwork for him then.  See if this works for him thanks.

## 2023-08-09 NOTE — Telephone Encounter (Signed)
 Moved appt to 3.24.25 and notified the patient

## 2023-08-12 ENCOUNTER — Ambulatory Visit (INDEPENDENT_AMBULATORY_CARE_PROVIDER_SITE_OTHER): Admitting: Family Medicine

## 2023-08-12 ENCOUNTER — Encounter: Payer: Self-pay | Admitting: Family Medicine

## 2023-08-12 DIAGNOSIS — R7401 Elevation of levels of liver transaminase levels: Secondary | ICD-10-CM

## 2023-08-12 DIAGNOSIS — N2889 Other specified disorders of kidney and ureter: Secondary | ICD-10-CM

## 2023-08-12 DIAGNOSIS — R972 Elevated prostate specific antigen [PSA]: Secondary | ICD-10-CM

## 2023-08-12 DIAGNOSIS — I10 Essential (primary) hypertension: Secondary | ICD-10-CM

## 2023-08-12 NOTE — Assessment & Plan Note (Addendum)
 Incidentally noted on MRCP Will place [facility] referral - try to expedite this evaluation of concerning R kidney lesion.

## 2023-08-12 NOTE — Assessment & Plan Note (Addendum)
 Chronic, BP mildly elevated today but overall well controlled when checked at home. Continue losartan 50mg  daily with propranolol 20mg  BID. No other changes indicated.

## 2023-08-12 NOTE — Assessment & Plan Note (Signed)
 Improved on recheck, back down to 4s presumed BPH related.

## 2023-08-12 NOTE — Patient Instructions (Addendum)
 I'm glad you're feeling better from liver/bile duct standpoint.  Recheck labs today - liver function We will refer you to urology for further evaluation of incidentally noted right kidney lesion [phone] [facility].

## 2023-08-12 NOTE — Progress Notes (Signed)
 Ph: [phone] Fax: [phone]   Patient ID: Joel Morales, male    DOB: [birth year], 63 y.o.   MRN: [id number]  This visit was conducted in person.  BP (!) 144/66 (BP Location: Right Arm, Cuff Size: Large)   Pulse (!) 58   Temp 98.4 F (36.9 C) (Oral)   Ht 5\' 10"  (1.778 m)   Wt 210 lb 2 oz (95.3 kg)   SpO2 96%   BMI 30.15 kg/m   BP Readings from Last 3 Encounters:  08/12/23 (!) 144/66  08/06/23 124/68  05/03/23 (!) 115/58    CC: hosp f/u visit  Subjective:   HPI: Joel Morales is a 63 y.o. male presenting on 08/12/2023 for Hospitalization Follow-up (Admitted on 08/02/23 at [facility], dx transaminitis; elevated bilirubin; abd pain.)   Recent hospitalization for abdominal pain, nausea, vomiting, anorexia, found to have hyperbilirubinemia jaundice and transaminitis of unclear etiology.  Hospital records reviewed. Med rec performed.  He was hypotensive in ER to 87/66, HR 116, RR 27. WBC peaked at 21 with left shift, lipase and CT abd/pelvis were reassuring. Acute hepatitis panel was normal, anti smooth muscle antibody normal.  He did have acute kidney injuyr with Cr peak to 2.1, thought prerenal.  Blcx x2 negative, HIV negative.   CT abd/pelvis unrevealing. He further underwent MRCP showing dilation of extrahepatic bile duct without choledocholithiasis or obstructing mass. Rocephin and flagyl started, discontinued prior to discharge.   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 No filling defect noted during intraoperative cholangiogram.   Incidentally noted renal mass (partially exophytic 1.2x1.5cm enhancing mass arising from R kidney upper pole anteriorly concerning for solid renal neoplasm) - referred to urology. Urinalysis at hospital without blood. Appt pending - has not been contacted. Will place new referral  BP elevated today despite losartan 50mg  daily - taking this daily.  Since  home, has had mild exertional dyspnea. Denies fevers/chills, chest pain, blood in urine, blood in stool, diarrhea, abd pain, nausea/vomiting, dizziness, headache or anorexia.   Home health not set up.  Other follow up appointments scheduled: none ______________________________________________________________________ Hospital admission: 08/02/2023 Hospital discharge: 08/06/2023 TCM f/u phone call: not performed   Recommendations at discharge:   Follow up with Urology.    Discharge Diagnoses: Principal Problem:   Transaminitis Active Problems:   Hypotension   Nausea and vomiting   Abdominal pain   Hyperbilirubinemia   Acute kidney injury superimposed on chronic kidney disease (HCC)   Essential hypertension   History of cholecystectomy   History of hemorrhoids   BPH (benign prostatic hyperplasia)   GERD (gastroesophageal reflux disease)   Insomnia   Constipation   Renal mass, right     Relevant past medical, surgical, family and social history reviewed and updated as indicated. Interim medical history since our last visit reviewed. Allergies and medications reviewed and updated. Outpatient Medications Prior to Visit  Medication Sig Dispense Refill   acetaminophen (TYLENOL) 500 MG tablet Take 2 tablets (1,000 mg total) by mouth every 6 (six) hours. (Patient taking differently: Take 500 mg by mouth daily as needed for mild pain (pain score 1-3).) 30 tablet 0   diphenhydrAMINE (BENADRYL) 25 MG tablet Take 25 mg by mouth every 6 (six) hours as needed for sleep.     fluticasone (FLONASE) 50 MCG/ACT nasal spray Place daily into both nostrils.     hydrocortisone 2.5 % cream Apply topically as needed.     levocetirizine (XYZAL) 5 MG  tablet Take 5 mg by mouth every evening.     losartan (COZAAR) 50 MG tablet Take 1 tablet (50 mg total) by mouth daily. 90 tablet 4   Melatonin 10 MG TABS Take 10 mg by mouth at bedtime as needed (sleep).     Multiple Vitamin (MULTIVITAMIN) tablet Take 1  tablet by mouth daily.     ondansetron (ZOFRAN-ODT) 4 MG disintegrating tablet Take 1 tablet (4 mg total) by mouth every 8 (eight) hours as needed. 20 tablet 0   pantoprazole (PROTONIX) 20 MG tablet Take 1 tablet (20 mg total) by mouth daily. 30 tablet 0   propranolol (INDERAL) 40 MG tablet Take 0.5 tablets (20 mg total) by mouth 2 (two) times daily. 90 tablet 4   tamsulosin (FLOMAX) 0.4 MG CAPS capsule Take 1 capsule (0.4 mg total) by mouth daily after supper. 90 capsule 4   senna-docusate (SENOKOT-S) 8.6-50 MG tablet Take 1 tablet by mouth 2 (two) times daily. 30 tablet 0   No facility-administered medications prior to visit.     Per HPI unless specifically indicated in ROS section below Review of Systems  Objective:  BP (!) 144/66 (BP Location: Right Arm, Cuff Size: Large)   Pulse (!) 58   Temp 98.4 F (36.9 C) (Oral)   Ht 5\' 10"  (1.778 m)   Wt 210 lb 2 oz (95.3 kg)   SpO2 96%   BMI 30.15 kg/m   Wt Readings from Last 3 Encounters:  08/12/23 210 lb 2 oz (95.3 kg)  08/02/23 210 lb 1.6 oz (95.3 kg)  05/03/23 210 lb (95.3 kg)      Physical Exam Vitals and nursing note reviewed.  Constitutional:      Appearance: Normal appearance. He is not ill-appearing.  HENT:     Head: Normocephalic and atraumatic.     Mouth/Throat:     Mouth: Mucous membranes are moist.     Pharynx: Oropharynx is clear. No oropharyngeal exudate or posterior oropharyngeal erythema.  Eyes:     Extraocular Movements: Extraocular movements intact.     Conjunctiva/sclera: Conjunctivae normal.     Pupils: Pupils are equal, round, and reactive to light.  Neck:     Thyroid: No thyroid mass or thyromegaly.  Cardiovascular:     Rate and Rhythm: Normal rate and regular rhythm.     Pulses: Normal pulses.     Heart sounds: Normal heart sounds. No murmur heard. Pulmonary:     Effort: Pulmonary effort is normal. No respiratory distress.     Breath sounds: Normal breath sounds. No wheezing, rhonchi or rales.   Abdominal:     General: Bowel sounds are normal. There is no distension.     Palpations: Abdomen is soft. There is no hepatomegaly, splenomegaly or mass.     Tenderness: There is no abdominal tenderness. There is no right CVA tenderness, left CVA tenderness, guarding or rebound. Negative signs include Murphy's sign.     Hernia: No hernia is present.  Musculoskeletal:     Cervical back: Normal range of motion and neck supple.     Right lower leg: No edema.     Left lower leg: No edema.  Skin:    General: Skin is warm and dry.     Findings: No rash.  Neurological:     Mental Status: He is alert.  Psychiatric:        Mood and Affect: Mood normal.        Behavior: Behavior normal.  Results for orders placed or performed during the hospital encounter of 08/02/23  Urinalysis, Routine w reflex microscopic -Urine, Clean Catch   Collection Time: 08/02/23  8:07 PM  Result Value Ref Range   Color, Urine AMBER (A) YELLOW   APPearance HAZY (A) CLEAR   Specific Gravity, Urine 1.018 1.005 - 1.030   pH 5.0 5.0 - 8.0   Glucose, UA NEGATIVE NEGATIVE mg/dL   Hgb urine dipstick NEGATIVE NEGATIVE   Bilirubin Urine NEGATIVE NEGATIVE   Ketones, ur 5 (A) NEGATIVE mg/dL   Protein, ur 30 (A) NEGATIVE mg/dL   Nitrite NEGATIVE NEGATIVE   Leukocytes,Ua NEGATIVE NEGATIVE   RBC / HPF 0-5 0 - 5 RBC/hpf   WBC, UA 0-5 0 - 5 WBC/hpf   Bacteria, UA NONE SEEN NONE SEEN   Squamous Epithelial / HPF 0-5 0 - 5 /HPF   Mucus PRESENT    Amorphous Crystal PRESENT   CBC with Differential   Collection Time: 08/02/23  8:16 PM  Result Value Ref Range   WBC 18.8 (H) 4.0 - 10.5 K/uL   RBC 5.16 4.22 - 5.81 MIL/uL   Hemoglobin 16.0 13.0 - 17.0 g/dL   HCT 16.1 09.6 - 04.5 %   MCV 89.7 80.0 - 100.0 fL   MCH 31.0 26.0 - 34.0 pg   MCHC 34.6 30.0 - 36.0 g/dL   RDW 40.9 81.1 - 91.4 %   Platelets 257 150 - 400 K/uL   nRBC 0.0 0.0 - 0.2 %   Neutrophils Relative % 91 %   Neutro Abs 17.3 (H) 1.7 - 7.7 K/uL    Lymphocytes Relative 2 %   Lymphs Abs 0.3 (L) 0.7 - 4.0 K/uL   Monocytes Relative 6 %   Monocytes Absolute 1.1 (H) 0.1 - 1.0 K/uL   Eosinophils Relative 0 %   Eosinophils Absolute 0.0 0.0 - 0.5 K/uL   Basophils Relative 0 %   Basophils Absolute 0.0 0.0 - 0.1 K/uL   Immature Granulocytes 1 %   Abs Immature Granulocytes 0.10 (H) 0.00 - 0.07 K/uL  Comprehensive metabolic panel   Collection Time: 08/02/23  8:16 PM  Result Value Ref Range   Sodium 139 135 - 145 mmol/L   Potassium 3.8 3.5 - 5.1 mmol/L   Chloride 104 98 - 111 mmol/L   CO2 21 (L) 22 - 32 mmol/L   Glucose, Bld 147 (H) 70 - 99 mg/dL   BUN 10 8 - 23 mg/dL   Creatinine, Ser 7.82 (H) 0.61 - 1.24 mg/dL   Calcium 9.2 8.9 - 95.6 mg/dL   Total Protein 6.7 6.5 - 8.1 g/dL   Albumin 3.8 3.5 - 5.0 g/dL   AST 213 (H) 15 - 41 U/L   ALT 533 (H) 0 - 44 U/L   Alkaline Phosphatase 93 38 - 126 U/L   Total Bilirubin 8.8 (H) 0.0 - 1.2 mg/dL   GFR, Estimated 47 (L) >60 mL/min   Anion gap 14 5 - 15  Lipase, blood   Collection Time: 08/02/23  8:16 PM  Result Value Ref Range   Lipase 26 11 - 51 U/L  Acetaminophen level   Collection Time: 08/03/23  1:11 AM  Result Value Ref Range   Acetaminophen (Tylenol), Serum <10 (L) 10 - 30 ug/mL  Hepatitis panel, acute   Collection Time: 08/03/23  1:11 AM  Result Value Ref Range   Hepatitis B Surface Ag NON REACTIVE NON REACTIVE   HCV Ab NON REACTIVE NON REACTIVE   Hep A IgM  NON REACTIVE NON REACTIVE   Hep B C IgM NON REACTIVE NON REACTIVE  Lipase, blood   Collection Time: 08/03/23  1:11 AM  Result Value Ref Range   Lipase 26 11 - 51 U/L  Bilirubin, fractionated(tot/dir/indir)   Collection Time: 08/03/23  6:42 AM  Result Value Ref Range   Total Bilirubin 7.3 (H) 0.0 - 1.2 mg/dL   Bilirubin, Direct 4.6 (H) 0.0 - 0.2 mg/dL   Indirect Bilirubin 2.7 (H) 0.3 - 0.9 mg/dL  Anti-smooth muscle antibody, IgG   Collection Time: 08/03/23  6:43 AM  Result Value Ref Range   F-Actin IgG 7 0 - 19 Units   Comprehensive metabolic panel   Collection Time: 08/03/23  6:43 AM  Result Value Ref Range   Sodium 135 135 - 145 mmol/L   Potassium 3.8 3.5 - 5.1 mmol/L   Chloride 105 98 - 111 mmol/L   CO2 24 22 - 32 mmol/L   Glucose, Bld 146 (H) 70 - 99 mg/dL   BUN 16 8 - 23 mg/dL   Creatinine, Ser 1.19 (H) 0.61 - 1.24 mg/dL   Calcium 8.2 (L) 8.9 - 10.3 mg/dL   Total Protein 5.4 (L) 6.5 - 8.1 g/dL   Albumin 3.0 (L) 3.5 - 5.0 g/dL   AST 147 (H) 15 - 41 U/L   ALT 337 (H) 0 - 44 U/L   Alkaline Phosphatase 77 38 - 126 U/L   Total Bilirubin 7.4 (H) 0.0 - 1.2 mg/dL   GFR, Estimated 33 (L) >60 mL/min   Anion gap 6 5 - 15  CBC   Collection Time: 08/03/23  6:43 AM  Result Value Ref Range   WBC 21.0 (H) 4.0 - 10.5 K/uL   RBC 4.34 4.22 - 5.81 MIL/uL   Hemoglobin 13.4 13.0 - 17.0 g/dL   HCT 82.9 (L) 56.2 - 13.0 %   MCV 89.6 80.0 - 100.0 fL   MCH 30.9 26.0 - 34.0 pg   MCHC 34.4 30.0 - 36.0 g/dL   RDW 86.5 78.4 - 69.6 %   Platelets 192 150 - 400 K/uL   nRBC 0.0 0.0 - 0.2 %  APTT   Collection Time: 08/03/23  6:43 AM  Result Value Ref Range   aPTT 29 24 - 36 seconds  Protime-INR   Collection Time: 08/03/23  6:43 AM  Result Value Ref Range   Prothrombin Time 18.7 (H) 11.4 - 15.2 seconds   INR 1.5 (H) 0.8 - 1.2  Hepatitis panel, acute   Collection Time: 08/03/23  6:43 AM  Result Value Ref Range   Hepatitis B Surface Ag NON REACTIVE NON REACTIVE   HCV Ab NON REACTIVE NON REACTIVE   Hep A IgM NON REACTIVE NON REACTIVE   Hep B C IgM NON REACTIVE NON REACTIVE  Cortisol-am, blood   Collection Time: 08/03/23  6:43 AM  Result Value Ref Range   Cortisol - AM 29.4 (H) 6.7 - 22.6 ug/dL  TSH   Collection Time: 08/03/23  6:43 AM  Result Value Ref Range   TSH 0.378 0.350 - 4.500 uIU/mL  Culture, blood (Routine X 2) w Reflex to ID Panel   Collection Time: 08/03/23 12:42 PM   Specimen: BLOOD RIGHT ARM  Result Value Ref Range   Specimen Description BLOOD RIGHT ARM    Special Requests      BOTTLES  DRAWN AEROBIC AND ANAEROBIC Blood Culture results may not be optimal due to an inadequate volume of blood received in culture bottles   Culture  NO GROWTH 5 DAYS Performed at [facility], [address] [place], [place] [address]    Report Status 08/08/2023 FINAL   Culture, blood (Routine X 2) w Reflex to ID Panel   Collection Time: 08/03/23 12:47 PM   Specimen: BLOOD LEFT ARM  Result Value Ref Range   Specimen Description BLOOD LEFT ARM    Special Requests      BOTTLES DRAWN AEROBIC AND ANAEROBIC Blood Culture adequate volume   Culture      NO GROWTH 5 DAYS Performed at [facility], [address] [place], [place] [address]    Report Status 08/08/2023 FINAL   HIV Antibody (routine testing w rflx)   Collection Time: 08/03/23 12:47 PM  Result Value Ref Range   HIV Screen 4th Generation wRfx Non Reactive Non Reactive  Lactic acid, plasma   Collection Time: 08/03/23 12:47 PM  Result Value Ref Range   Lactic Acid, Venous 1.4 0.5 - 1.9 mmol/L  Procalcitonin   Collection Time: 08/03/23 12:47 PM  Result Value Ref Range   Procalcitonin 14.34 ng/mL  Hemoglobin A1c   Collection Time: 08/03/23 12:47 PM  Result Value Ref Range   Hgb A1c MFr Bld 4.8 4.8 - 5.6 %   Mean Plasma Glucose 91.06 mg/dL  Lactic acid, plasma   Collection Time: 08/03/23  4:43 PM  Result Value Ref Range   Lactic Acid, Venous 0.9 0.5 - 1.9 mmol/L  Brain natriuretic peptide   Collection Time: 08/03/23  7:37 PM  Result Value Ref Range   B Natriuretic Peptide 151.2 (H) 0.0 - 100.0 pg/mL  Troponin I (High Sensitivity)   Collection Time: 08/03/23  7:37 PM  Result Value Ref Range   Troponin I (High Sensitivity) 10 <18 ng/L  Troponin I (High Sensitivity)   Collection Time: 08/03/23  9:02 PM  Result Value Ref Range   Troponin I (High Sensitivity) 9 <18 ng/L  Comprehensive metabolic panel   Collection Time: 08/04/23  7:42 AM  Result Value Ref Range   Sodium 140 135 - 145 mmol/L   Potassium 3.6  3.5 - 5.1 mmol/L   Chloride 107 98 - 111 mmol/L   CO2 23 22 - 32 mmol/L   Glucose, Bld 94 70 - 99 mg/dL   BUN 12 8 - 23 mg/dL   Creatinine, Ser 0.98 (H) 0.61 - 1.24 mg/dL   Calcium 8.4 (L) 8.9 - 10.3 mg/dL   Total Protein 5.5 (L) 6.5 - 8.1 g/dL   Albumin 2.9 (L) 3.5 - 5.0 g/dL   AST 92 (H) 15 - 41 U/L   ALT 236 (H) 0 - 44 U/L   Alkaline Phosphatase 72 38 - 126 U/L   Total Bilirubin 5.7 (H) 0.0 - 1.2 mg/dL   GFR, Estimated 55 (L) >60 mL/min   Anion gap 10 5 - 15  Magnesium   Collection Time: 08/04/23  7:42 AM  Result Value Ref Range   Magnesium 2.0 1.7 - 2.4 mg/dL  CBC with Differential/Platelet   Collection Time: 08/04/23  7:42 AM  Result Value Ref Range   WBC 7.1 4.0 - 10.5 K/uL   RBC 4.33 4.22 - 5.81 MIL/uL   Hemoglobin 13.4 13.0 - 17.0 g/dL   HCT 11.9 (L) 14.7 - 82.9 %   MCV 88.7 80.0 - 100.0 fL   MCH 30.9 26.0 - 34.0 pg   MCHC 34.9 30.0 - 36.0 g/dL   RDW 56.2 13.0 - 86.5 %   Platelets 143 (L) 150 -  400 K/uL   nRBC 0.0 0.0 - 0.2 %   Neutrophils Relative % 66 %   Neutro Abs 4.7 1.7 - 7.7 K/uL   Lymphocytes Relative 12 %   Lymphs Abs 0.9 0.7 - 4.0 K/uL   Monocytes Relative 19 %   Monocytes Absolute 1.4 (H) 0.1 - 1.0 K/uL   Eosinophils Relative 3 %   Eosinophils Absolute 0.2 0.0 - 0.5 K/uL   Basophils Relative 0 %   Basophils Absolute 0.0 0.0 - 0.1 K/uL   Immature Granulocytes 0 %   Abs Immature Granulocytes 0.02 0.00 - 0.07 K/uL  Comprehensive metabolic panel   Collection Time: 08/05/23  6:20 AM  Result Value Ref Range   Sodium 137 135 - 145 mmol/L   Potassium 3.6 3.5 - 5.1 mmol/L   Chloride 106 98 - 111 mmol/L   CO2 22 22 - 32 mmol/L   Glucose, Bld 83 70 - 99 mg/dL   BUN 7 (L) 8 - 23 mg/dL   Creatinine, Ser 1.61 0.61 - 1.24 mg/dL   Calcium 8.2 (L) 8.9 - 10.3 mg/dL   Total Protein 5.4 (L) 6.5 - 8.1 g/dL   Albumin 2.6 (L) 3.5 - 5.0 g/dL   AST 48 (H) 15 - 41 U/L   ALT 168 (H) 0 - 44 U/L   Alkaline Phosphatase 59 38 - 126 U/L   Total Bilirubin 3.6 (H) 0.0 -  1.2 mg/dL   GFR, Estimated >09 >60 mL/min   Anion gap 9 5 - 15  CBC   Collection Time: 08/05/23  6:20 AM  Result Value Ref Range   WBC 6.1 4.0 - 10.5 K/uL   RBC 4.19 (L) 4.22 - 5.81 MIL/uL   Hemoglobin 12.8 (L) 13.0 - 17.0 g/dL   HCT 45.4 (L) 09.8 - 11.9 %   MCV 90.0 80.0 - 100.0 fL   MCH 30.5 26.0 - 34.0 pg   MCHC 34.0 30.0 - 36.0 g/dL   RDW 14.7 82.9 - 56.2 %   Platelets 152 150 - 400 K/uL   nRBC 0.0 0.0 - 0.2 %  Comprehensive metabolic panel   Collection Time: 08/06/23  5:52 AM  Result Value Ref Range   Sodium 138 135 - 145 mmol/L   Potassium 3.4 (L) 3.5 - 5.1 mmol/L   Chloride 108 98 - 111 mmol/L   CO2 22 22 - 32 mmol/L   Glucose, Bld 95 70 - 99 mg/dL   BUN 10 8 - 23 mg/dL   Creatinine, Ser 1.30 0.61 - 1.24 mg/dL   Calcium 8.3 (L) 8.9 - 10.3 mg/dL   Total Protein 5.5 (L) 6.5 - 8.1 g/dL   Albumin 2.7 (L) 3.5 - 5.0 g/dL   AST 36 15 - 41 U/L   ALT 127 (H) 0 - 44 U/L   Alkaline Phosphatase 62 38 - 126 U/L   Total Bilirubin 2.8 (H) 0.0 - 1.2 mg/dL   GFR, Estimated >86 >57 mL/min   Anion gap 8 5 - 15  CBC   Collection Time: 08/06/23  5:52 AM  Result Value Ref Range   WBC 5.4 4.0 - 10.5 K/uL   RBC 4.29 4.22 - 5.81 MIL/uL   Hemoglobin 13.1 13.0 - 17.0 g/dL   HCT 84.6 (L) 96.2 - 95.2 %   MCV 87.9 80.0 - 100.0 fL   MCH 30.5 26.0 - 34.0 pg   MCHC 34.7 30.0 - 36.0 g/dL   RDW 84.1 32.4 - 40.1 %   Platelets 170 150 - 400  K/uL   nRBC 0.0 0.0 - 0.2 %    Assessment & Plan:   Problem List Items Addressed This Visit     Essential hypertension   Chronic, BP mildly elevated today but overall well controlled when checked at home. Continue losartan 50mg  daily with propranolol 20mg  BID. No other changes indicated.       Elevated PSA   Improved on recheck, back down to 4s presumed BPH related.       Transaminitis   Relevant Orders   Comprehensive metabolic panel   Hyperbilirubinemia - Primary   Presumed due to residual sludge after cholecystectomy 04/2023 leading to  obstructive biliary symptoms with transaminitis and hyperbilirubinemia.  Symptoms largely resolved.  Update CMP today.       Relevant Orders   Comprehensive metabolic panel   Renal mass, right   Incidentally noted on MRCP Will place [facility] referral - try to expedite this evaluation of concerning R kidney lesion.       Relevant Orders   Ambulatory referral to Urology     No orders of the defined types were placed in this encounter.   Orders Placed This Encounter  Procedures   Comprehensive metabolic panel   Ambulatory referral to Urology    Referral Priority:   Urgent    Referral Type:   Consultation    Referral Reason:   Specialty Services Required    Requested Specialty:   Urology    Number of Visits Requested:   1    Patient Instructions  I'm glad you're feeling better from liver/bile duct standpoint.  Recheck labs today - liver function We will refer you to urology for further evaluation of incidentally noted right kidney lesion [phone] [facility].   Follow up plan: Return in about 5 months (around 01/12/2024), or if symptoms worsen or fail to improve, for annual exam, prior fasting for blood work.  Eustaquio Boyden, MD

## 2023-08-12 NOTE — Assessment & Plan Note (Signed)
 Presumed due to residual sludge after cholecystectomy 04/2023 leading to obstructive biliary symptoms with transaminitis and hyperbilirubinemia.  Symptoms largely resolved.  Update CMP today.

## 2023-08-13 LAB — COMPREHENSIVE METABOLIC PANEL WITH GFR
ALT: 119 U/L — ABNORMAL HIGH (ref 0–53)
AST: 41 U/L — ABNORMAL HIGH (ref 0–37)
Albumin: 4.1 g/dL (ref 3.5–5.2)
Alkaline Phosphatase: 92 U/L (ref 39–117)
BUN: 13 mg/dL (ref 6–23)
CO2: 28 meq/L (ref 19–32)
Calcium: 8.9 mg/dL (ref 8.4–10.5)
Chloride: 103 meq/L (ref 96–112)
Creatinine, Ser: 1.1 mg/dL (ref 0.40–1.50)
GFR: 71.3 mL/min
Glucose, Bld: 89 mg/dL (ref 70–99)
Potassium: 4.4 meq/L (ref 3.5–5.1)
Sodium: 138 meq/L (ref 135–145)
Total Bilirubin: 1.4 mg/dL — ABNORMAL HIGH (ref 0.2–1.2)
Total Protein: 6.9 g/dL (ref 6.0–8.3)

## 2023-08-15 ENCOUNTER — Encounter: Payer: Self-pay | Admitting: Family Medicine

## 2023-08-19 ENCOUNTER — Inpatient Hospital Stay: Admitting: Family Medicine

## 2023-09-11 ENCOUNTER — Other Ambulatory Visit: Payer: Self-pay

## 2023-09-11 MED ORDER — FLUTICASONE PROPIONATE 50 MCG/ACT NA SUSP: 2.0000 | Freq: Every day | NASAL | 1 refills | Status: DC

## 2023-09-11 MED ORDER — LEVOCETIRIZINE DIHYDROCHLORIDE 5 MG PO TABS: 5.0000 mg | ORAL_TABLET | Freq: Every evening | ORAL | 1 refills | Status: DC

## 2023-09-11 NOTE — Telephone Encounter (Signed)
 ERx

## 2023-09-11 NOTE — Telephone Encounter (Signed)
 Requested by pharmacy for 90 day supply. You have not given in the past.

## 2023-10-16 ENCOUNTER — Other Ambulatory Visit: Payer: Self-pay | Admitting: Family Medicine

## 2023-10-16 NOTE — Telephone Encounter (Signed)
 Message from pharmacy:  REQUEST FOR 90 DAYS PRESCRIPTION.   Flonase  Last filled:  10/04/23, #16 g Last OV:  08/12/23, HFU Next OV:  none

## 2023-10-18 ENCOUNTER — Ambulatory Visit
Admission: EM | Admit: 2023-10-18 | Discharge: 2023-10-18 | Disposition: A | Discharge status: Home / Self Care | Attending: Emergency Medicine | Admitting: Emergency Medicine

## 2023-10-18 ENCOUNTER — Ambulatory Visit

## 2023-10-18 ENCOUNTER — Encounter: Payer: Self-pay | Admitting: Family Medicine

## 2023-10-18 DIAGNOSIS — R310 Gross hematuria: Secondary | ICD-10-CM | POA: Diagnosis not present

## 2023-10-18 LAB — POCT URINALYSIS DIP (MANUAL ENTRY)
Bilirubin, UA: NEGATIVE
Glucose, UA: NEGATIVE mg/dL
Ketones, POC UA: NEGATIVE mg/dL
Leukocytes, UA: NEGATIVE
Nitrite, UA: NEGATIVE
Protein Ur, POC: NEGATIVE mg/dL
Spec Grav, UA: 1.015
Urobilinogen, UA: 0.2 U/dL
pH, UA: 7

## 2023-10-18 NOTE — Discharge Instructions (Addendum)
 Your urine does not show signs of infection.  It does have "small" blood.  This needs to be rechecked by your primary care provider.  Follow up with your primary care provider on Monday.  Go to the emergency department if you have worsening symptoms.

## 2023-10-18 NOTE — ED Triage Notes (Signed)
 Patient to Urgent Care with complaints of hematuria/ dysuria.   Symptoms started this morning. Reports seeing a few pink drops at the end of urinating.

## 2023-10-18 NOTE — ED Provider Notes (Signed)
 [name]    CSN: [id number] Arrival date & time: 10/18/23  1037      History   Chief Complaint Chief Complaint  Patient presents with   Hematuria    HPI [name] is a 63 y.o. male.  Patient presents with dysuria and hematuria since this morning.  He was providing a urine specimen for an insurance physical and noted some pink drops at the end of his urine stream.  He reports no difficulty in producing or maintaining a urine stream.  No fever, abdominal pain, flank pain, penile discharge, testicular pain.  His medical history includes BPH.  The history is provided by the patient and medical records.    Past Medical History:  Diagnosis Date   Achilles tendinitis, right leg 07/20/2018   Allergy    Anxiety    Arthritis    Hypertension    Left tennis elbow 07/20/2018    Patient Active Problem List   Diagnosis Date Noted   Renal mass, right 08/04/2023   Transaminitis 08/03/2023   Hyperbilirubinemia 08/03/2023   History of cholecystectomy 08/03/2023   History of hemorrhoids 08/03/2023   BPH (benign prostatic hyperplasia) 08/03/2023   GERD (gastroesophageal reflux disease) 08/03/2023   Insomnia 08/03/2023   Constipation 08/03/2023   Elevated PSA 01/02/2023   Family history of coronary artery disease 01/02/2023   Health maintenance examination 12/06/2020   Tremor 07/12/2020   Chronic elbow pain, left 12/04/2019   Essential hypertension 12/04/2019   Bilateral bunions 07/20/2018   Residual hemorrhoid tags 11/19/2014   Benign prostatic hyperplasia with weak urinary stream 11/19/2014    Past Surgical History:  Procedure Laterality Date   CHOLECYSTECTOMY N/A 05/03/2023   LAPAROSCOPIC CHOLECYSTECTOMY WITH INTRAOPERATIVE CHOLANGIOGRAM;  Surgeon: Dareen Ebbing, MD;  Location: [facility] OR   COLONOSCOPY  09/2011   TA, diverticulosis, rpt 5 yrs Nickey Barn)   COLONOSCOPY  08/2022   TAx8, diverticulosis, rpt 3 yrs Nickey Barn)   TONSILLECTOMY         Home Medications     Prior to Admission medications   Medication Sig Start Date End Date Taking? Authorizing Provider  acetaminophen  (TYLENOL ) 500 MG tablet Take 2 tablets (1,000 mg total) by mouth every 6 (six) hours. Patient taking differently: Take 500 mg by mouth daily as needed for mild pain (pain score 1-3). 05/03/23   Charlott Converse, PA-C  diphenhydrAMINE  (BENADRYL ) 25 MG tablet Take 25 mg by mouth every 6 (six) hours as needed for sleep.    [provider]  fluticasone  (FLONASE ) 50 MCG/ACT nasal spray SPRAY 2 SPRAYS INTO EACH NOSTRIL EVERY DAY 10/18/23   Claire Crick, MD  hydrocortisone  2.5 % cream Apply topically as needed.    [provider]  levocetirizine (XYZAL ) 5 MG tablet Take 1 tablet (5 mg total) by mouth every evening. 09/11/23   Claire Crick, MD  losartan  (COZAAR ) 50 MG tablet Take 1 tablet (50 mg total) by mouth daily. 01/02/23   Claire Crick, MD  Melatonin 10 MG TABS Take 10 mg by mouth at bedtime as needed (sleep).    [provider]  Multiple Vitamin (MULTIVITAMIN) tablet Take 1 tablet by mouth daily.    [provider]  ondansetron  (ZOFRAN -ODT) 4 MG disintegrating tablet Take 1 tablet (4 mg total) by mouth every 8 (eight) hours as needed. Patient not taking: Reported on 10/18/2023 08/02/23   Mardene Shake, FNP  pantoprazole  (PROTONIX ) 20 MG tablet Take 1 tablet (20 mg total) by mouth daily. Patient not taking: Reported on  10/18/2023 08/06/23 09/05/23  Mdala-Gausi, Masiku Agatha, MD  propranolol  (INDERAL ) 40 MG tablet Take 0.5 tablets (20 mg total) by mouth 2 (two) times daily. 01/02/23   Claire Crick, MD  tamsulosin  (FLOMAX ) 0.4 MG CAPS capsule Take 1 capsule (0.4 mg total) by mouth daily after supper. 01/02/23   Claire Crick, MD    Family History Family History  Problem Relation Age of Onset   Heart disease Mother 27       CABG x 3   Hypertension Mother    COPD Mother    Hypertension Maternal Grandmother    Stroke Maternal  Grandmother    Stroke Maternal Grandfather    Cancer Maternal Grandfather        stomach    Social History Social History   Tobacco Use   Smoking status: Never   Smokeless tobacco: Never  Vaping Use   Vaping status: Never Used  Substance Use Topics   Alcohol use: Yes    Alcohol/week: 0.0 standard drinks of alcohol   Drug use: No     Allergies   Chicken allergy   Review of Systems Review of Systems  Constitutional:  Negative for chills and fever.  Gastrointestinal:  Negative for abdominal pain.  Genitourinary:  Positive for dysuria and hematuria. Negative for flank pain, penile discharge and testicular pain.     Physical Exam Triage Vital Signs ED Triage Vitals  Encounter Vitals Group     BP 10/18/23 1104 (!) 146/78     Systolic BP Percentile --      Diastolic BP Percentile --      Pulse Rate 10/18/23 1104 64     Resp 10/18/23 1104 16     Temp 10/18/23 1104 98.3 F (36.8 C)     Temp src --      SpO2 10/18/23 1104 98 %     Weight --      Height --      Head Circumference --      Peak Flow --      Pain Score 10/18/23 1056 0     Pain Loc --      Pain Education --      Exclude from Growth Chart --    No data found.  Updated Vital Signs BP (!) 146/78   Pulse 64   Temp 98.3 F (36.8 C)   Resp 16   SpO2 98%   Visual Acuity Right Eye Distance:   Left Eye Distance:   Bilateral Distance:    Right Eye Near:   Left Eye Near:    Bilateral Near:     Physical Exam Constitutional:      General: He is not in acute distress. HENT:     Mouth/Throat:     Mouth: Mucous membranes are moist.  Cardiovascular:     Rate and Rhythm: Normal rate and regular rhythm.  Pulmonary:     Effort: Pulmonary effort is normal. No respiratory distress.  Abdominal:     General: Bowel sounds are normal.     Palpations: Abdomen is soft.     Tenderness: There is no abdominal tenderness. There is no right CVA tenderness, left CVA tenderness, guarding or rebound.   Neurological:     Mental Status: He is alert.      UC Treatments / Results  Labs (all labs ordered are listed, but only abnormal results are displayed) Labs Reviewed  POCT URINALYSIS DIP (MANUAL ENTRY) - Abnormal; Notable for the following components:  Result Value   Color, UA light yellow (*)    Blood, UA small (*)    All other components within normal limits    EKG   Radiology No results found.  Procedures Procedures (including critical care time)  Medications Ordered in UC Medications - No data to display  Initial Impression / Assessment and Plan / UC Course  I have reviewed the triage vital signs and the nursing notes.  Pertinent labs & imaging results that were available during my care of the patient were reviewed by me and considered in my medical decision making (see chart for details).    Hematuria.  Urine has small blood but no signs of infection.  Afebrile and vital signs are stable.  Abdomen is soft and nontender with good bowel sounds.  No CVAT.  Discussed hematuria and instructed patient to follow-up with his PCP on Monday.  ED precautions given.  Education provided on hematuria.  He agrees to plan of care.  Final Clinical Impressions(s) / UC Diagnoses   Final diagnoses:  Gross hematuria     Discharge Instructions      Your urine does not show signs of infection.  It does have "small" blood.  This needs to be rechecked by your primary care provider.  Follow up with your primary care provider on Monday.  Go to the emergency department if you have worsening symptoms.      ED Prescriptions   None    PDMP not reviewed this encounter.   Wellington Half, NP 10/18/23 1125

## 2023-11-26 ENCOUNTER — Ambulatory Visit
Admission: RE | Admit: 2023-11-26 | Discharge: 2023-11-26 | Disposition: A | Discharge status: Home / Self Care | Source: Ambulatory Visit | Attending: Emergency Medicine | Admitting: Emergency Medicine

## 2023-11-26 VITALS — BP 132/77 | HR 82 | Temp 98.2°F | Resp 18

## 2023-11-26 DIAGNOSIS — J069 Acute upper respiratory infection, unspecified: Secondary | ICD-10-CM

## 2023-11-26 LAB — POCT RAPID STREP A (OFFICE): Rapid Strep A Screen: NEGATIVE

## 2023-11-26 LAB — POC SARS CORONAVIRUS 2 AG -  ED: SARS Coronavirus 2 Ag: NEGATIVE

## 2023-11-26 MED ORDER — PROMETHAZINE-DM 6.25-15 MG/5ML PO SYRP: 5.0000 mL | ORAL_SOLUTION | Freq: Four times a day (QID) | ORAL | 0 refills | Status: DC | PRN

## 2023-11-26 NOTE — ED Provider Notes (Signed)
 [name]    CSN: [id number] Arrival date & time: 11/26/23  1840      History   Chief Complaint Chief Complaint  Patient presents with   Sore Throat    Cough, sore throat, fever, watery eyes/nose - Entered by patient    HPI Joel Morales is a 63 y.o. male.  Patient presents with 2-day history of watery eyes, runny nose, sore throat, cough.  No fever or shortness of breath.  Treatment attempted with Afrin nasal spray and allergy medication.  The history is provided by the patient and medical records.    Past Medical History:  Diagnosis Date   Achilles tendinitis, right leg 07/20/2018   Allergy    Anxiety    Arthritis    Hypertension    Left tennis elbow 07/20/2018    Patient Active Problem List   Diagnosis Date Noted   Renal mass, right 08/04/2023   Transaminitis 08/03/2023   Hyperbilirubinemia 08/03/2023   History of cholecystectomy 08/03/2023   History of hemorrhoids 08/03/2023   BPH (benign prostatic hyperplasia) 08/03/2023   GERD (gastroesophageal reflux disease) 08/03/2023   Insomnia 08/03/2023   Constipation 08/03/2023   Elevated PSA 01/02/2023   Family history of coronary artery disease 01/02/2023   Health maintenance examination 12/06/2020   Tremor 07/12/2020   Chronic elbow pain, left 12/04/2019   Essential hypertension 12/04/2019   Bilateral bunions 07/20/2018   Residual hemorrhoid tags 11/19/2014   Benign prostatic hyperplasia with weak urinary stream 11/19/2014    Past Surgical History:  Procedure Laterality Date   CHOLECYSTECTOMY N/A 05/03/2023   LAPAROSCOPIC CHOLECYSTECTOMY WITH INTRAOPERATIVE CHOLANGIOGRAM;  Surgeon: Belinda Cough, MD;  Location: [facility] OR   COLONOSCOPY  09/2011   TA, diverticulosis, rpt 5 yrs Joel Morales)   COLONOSCOPY  08/2022   TAx8, diverticulosis, rpt 3 yrs Joel Morales)   TONSILLECTOMY         Home Medications    Prior to Admission medications   Medication Sig Start Date End Date Taking? Authorizing Provider   promethazine -dextromethorphan (PROMETHAZINE -DM) 6.25-15 MG/5ML syrup Take 5 mLs by mouth 4 (four) times daily as needed. 11/26/23  Yes Corlis Burnard DEL, NP  acetaminophen  (TYLENOL ) 500 MG tablet Take 2 tablets (1,000 mg total) by mouth every 6 (six) hours. Patient taking differently: Take 500 mg by mouth daily as needed for mild pain (pain score 1-3). 05/03/23   Augustus Almarie RAMAN, PA-C  diphenhydrAMINE  (BENADRYL ) 25 MG tablet Take 25 mg by mouth every 6 (six) hours as needed for sleep.    [provider]  fluticasone  (FLONASE ) 50 MCG/ACT nasal spray SPRAY 2 SPRAYS INTO EACH NOSTRIL EVERY DAY 10/18/23   Rilla Baller, MD  hydrocortisone  2.5 % cream Apply topically as needed.    [provider]  levocetirizine (XYZAL ) 5 MG tablet Take 1 tablet (5 mg total) by mouth every evening. 09/11/23   Rilla Baller, MD  losartan  (COZAAR ) 50 MG tablet Take 1 tablet (50 mg total) by mouth daily. 01/02/23   Rilla Baller, MD  Melatonin 10 MG TABS Take 10 mg by mouth at bedtime as needed (sleep).    [provider]  Multiple Vitamin (MULTIVITAMIN) tablet Take 1 tablet by mouth daily.    [provider]  ondansetron  (ZOFRAN -ODT) 4 MG disintegrating tablet Take 1 tablet (4 mg total) by mouth every 8 (eight) hours as needed. Patient not taking: Reported on 10/18/2023 08/02/23   Joel Domino, FNP  pantoprazole  (PROTONIX ) 20 MG tablet Take 1 tablet (20 mg total) by  mouth daily. Patient not taking: Reported on 10/18/2023 08/06/23 09/05/23  Mdala-Gausi, Joel Agatha, MD  propranolol  (INDERAL ) 40 MG tablet Take 0.5 tablets (20 mg total) by mouth 2 (two) times daily. 01/02/23   Rilla Baller, MD  tamsulosin  (FLOMAX ) 0.4 MG CAPS capsule Take 1 capsule (0.4 mg total) by mouth daily after supper. 01/02/23   Rilla Baller, MD    Family History Family History  Problem Relation Age of Onset   Heart disease Mother 72       CABG x 3   Hypertension Mother    COPD Mother     Hypertension Maternal Grandmother    Stroke Maternal Grandmother    Stroke Maternal Grandfather    Cancer Maternal Grandfather        stomach    Social History Social History   Tobacco Use   Smoking status: Never   Smokeless tobacco: Never  Vaping Use   Vaping status: Never Used  Substance Use Topics   Alcohol use: Yes    Alcohol/week: 0.0 standard drinks of alcohol   Drug use: No     Allergies   Chicken allergy   Review of Systems Review of Systems  Constitutional:  Negative for chills and fever.  HENT:  Positive for congestion, postnasal drip, rhinorrhea and sore throat. Negative for ear discharge.   Respiratory:  Positive for cough. Negative for shortness of breath.      Physical Exam Triage Vital Signs ED Triage Vitals  Encounter Vitals Group     BP 11/26/23 1859 132/77     Girls Systolic BP Percentile --      Girls Diastolic BP Percentile --      Boys Systolic BP Percentile --      Boys Diastolic BP Percentile --      Pulse Rate 11/26/23 1859 82     Resp 11/26/23 1859 18     Temp 11/26/23 1859 98.2 F (36.8 C)     Temp src --      SpO2 11/26/23 1859 99 %     Weight --      Height --      Head Circumference --      Peak Flow --      Pain Score 11/26/23 1901 8     Pain Loc --      Pain Education --      Exclude from Growth Chart --    No data found.  Updated Vital Signs BP 132/77   Pulse 82   Temp 98.2 F (36.8 C)   Resp 18   SpO2 99%   Visual Acuity Right Eye Distance:   Left Eye Distance:   Bilateral Distance:    Right Eye Near:   Left Eye Near:    Bilateral Near:     Physical Exam Constitutional:      General: He is not in acute distress. HENT:     Right Ear: Tympanic membrane normal.     Left Ear: Tympanic membrane normal.     Nose: Congestion and rhinorrhea present.     Mouth/Throat:     Mouth: Mucous membranes are moist.     Pharynx: Posterior oropharyngeal erythema present.     Comments: PND Cardiovascular:     Rate  and Rhythm: Normal rate and regular rhythm.     Heart sounds: Normal heart sounds.  Pulmonary:     Effort: Pulmonary effort is normal. No respiratory distress.     Breath sounds: Normal breath sounds.  Neurological:  Mental Status: He is alert.      UC Treatments / Results  Labs (all labs ordered are listed, but only abnormal results are displayed) Labs Reviewed  POCT RAPID STREP A (OFFICE) - Normal  POC SARS CORONAVIRUS 2 AG -  ED    EKG   Radiology No results found.  Procedures Procedures (including critical care time)  Medications Ordered in UC Medications - No data to display  Initial Impression / Assessment and Plan / UC Course  I have reviewed the triage vital signs and the nursing notes.  Pertinent labs & imaging results that were available during my care of the patient were reviewed by me and considered in my medical decision making (see chart for details).    Viral URI with cough.  Rapid COVID negative.  Rapid strep negative.  Treating cough with Promethazine  DM.  Precautions for drowsiness with promethazine  discussed.  Tylenol  or ibuprofen  as needed.  Instructed patient to follow-up with his PCP if he is not improving.  Education provided on viral respiratory infection.  Patient agrees to plan of care.  Final Clinical Impressions(s) / UC Diagnoses   Final diagnoses:  Viral URI with cough     Discharge Instructions      The COVID and strep tests are negative.   Take the promethazine  as directed for cough.  Do not drive, operate machinery, drink alcohol, or perform dangerous activities while taking this medication as it may cause drowsiness.  Take Tylenol  or ibuprofen  as needed for fever or discomfort.    Follow-up with your primary care provider if your symptoms are not improving.         ED Prescriptions     Medication Sig Dispense Auth. Provider   promethazine -dextromethorphan (PROMETHAZINE -DM) 6.25-15 MG/5ML syrup Take 5 mLs by mouth 4  (four) times daily as needed. 118 mL Corlis Burnard DEL, NP      PDMP not reviewed this encounter.   Corlis Burnard DEL, NP 11/26/23 [phone]

## 2023-11-26 NOTE — ED Triage Notes (Signed)
 Patient to Urgent Care with complaints of cough/ sore throat/ runny nose/ watery eyes.   Symptoms started Sunday.   Meds: afrin nasal spray/ xyzal / benadryl 

## 2023-11-26 NOTE — Discharge Instructions (Addendum)
 The COVID and strep tests are negative.   Take the promethazine  as directed for cough.  Do not drive, operate machinery, drink alcohol, or perform dangerous activities while taking this medication as it may cause drowsiness.  Take Tylenol  or ibuprofen  as needed for fever or discomfort.    Follow-up with your primary care provider if your symptoms are not improving.

## 2023-11-29 ENCOUNTER — Ambulatory Visit
Admission: RE | Admit: 2023-11-29 | Discharge: 2023-11-29 | Disposition: A | Discharge status: Home / Self Care | Attending: Emergency Medicine

## 2023-11-29 VITALS — BP 135/77 | HR 71 | Temp 97.7°F | Resp 18

## 2023-11-29 DIAGNOSIS — J0100 Acute maxillary sinusitis, unspecified: Secondary | ICD-10-CM

## 2023-11-29 DIAGNOSIS — H109 Unspecified conjunctivitis: Secondary | ICD-10-CM | POA: Diagnosis not present

## 2023-11-29 MED ORDER — AMOXICILLIN-POT CLAVULANATE 875-125 MG PO TABS: 1.0000 | ORAL_TABLET | Freq: Two times a day (BID) | ORAL | 0 refills | Status: DC

## 2023-11-29 MED ORDER — POLYMYXIN B-TRIMETHOPRIM 10000-0.1 UNIT/ML-% OP SOLN: 1.0000 [drp] | Freq: Four times a day (QID) | OPHTHALMIC | 0 refills | Status: AC

## 2023-11-29 NOTE — ED Triage Notes (Signed)
 Patient to Urgent Care with complaints of left sided eye drainage that started this afternoon.   Recent URI.

## 2023-11-29 NOTE — Discharge Instructions (Addendum)
 Take the Augmentin  and use the antibiotic eyedrops as directed.  Follow-up with your primary care provider on Monday.

## 2023-11-29 NOTE — ED Provider Notes (Signed)
 Joel Morales    CSN: [id number] Arrival date & time: 11/29/23  1849      History   Chief Complaint Chief Complaint  Patient presents with   Eye Problem    Entered by patient    HPI Joel Morales is a 63 y.o. male.  Patient presents with left eye redness, itching, purulent drainage since this afternoon.  No eye trauma or eye pain.  His vision in his left eye is blurred from the purulent drainage.  He reports ongoing nasal congestion and runny nose x 5 days.  His cough and sore throat have improved.  He denies fever or shortness of breath.  Patient was seen here on 11/26/2023; diagnosed with viral URI with cough; negative strep and COVID; treated with Promethazine  DM.  The history is provided by the patient and medical records.    Past Medical History:  Diagnosis Date   Achilles tendinitis, right leg 07/20/2018   Allergy    Anxiety    Arthritis    Hypertension    Left tennis elbow 07/20/2018    Patient Active Problem List   Diagnosis Date Noted   Renal mass, right 08/04/2023   Transaminitis 08/03/2023   Hyperbilirubinemia 08/03/2023   History of cholecystectomy 08/03/2023   History of hemorrhoids 08/03/2023   BPH (benign prostatic hyperplasia) 08/03/2023   GERD (gastroesophageal reflux disease) 08/03/2023   Insomnia 08/03/2023   Constipation 08/03/2023   Elevated PSA 01/02/2023   Family history of coronary artery disease 01/02/2023   Health maintenance examination 12/06/2020   Tremor 07/12/2020   Chronic elbow pain, left 12/04/2019   Essential hypertension 12/04/2019   Bilateral bunions 07/20/2018   Residual hemorrhoid tags 11/19/2014   Benign prostatic hyperplasia with weak urinary stream 11/19/2014    Past Surgical History:  Procedure Laterality Date   CHOLECYSTECTOMY N/A 05/03/2023   LAPAROSCOPIC CHOLECYSTECTOMY WITH INTRAOPERATIVE CHOLANGIOGRAM;  Surgeon: Belinda Cough, MD;  Location: [facility] OR   COLONOSCOPY  09/2011   TA, diverticulosis, rpt 5 yrs  [facility])   COLONOSCOPY  08/2022   TAx8, diverticulosis, rpt 3 yrs [facility])   TONSILLECTOMY         Home Medications    Prior to Admission medications   Medication Sig Start Date End Date Taking? Authorizing Provider  amoxicillin -clavulanate (AUGMENTIN ) 875-125 MG tablet Take 1 tablet by mouth every 12 (twelve) hours. 11/29/23  Yes Corlis Burnard DEL, NP  trimethoprim -polymyxin b  (POLYTRIM ) ophthalmic solution Place 1 drop into both eyes 4 (four) times daily for 7 days. 11/29/23 12/06/23 Yes Corlis Burnard DEL, NP  acetaminophen  (TYLENOL ) 500 MG tablet Take 2 tablets (1,000 mg total) by mouth every 6 (six) hours. Patient taking differently: Take 500 mg by mouth daily as needed for mild pain (pain score 1-3). 05/03/23   Augustus Almarie RAMAN, PA-C  diphenhydrAMINE  (BENADRYL ) 25 MG tablet Take 25 mg by mouth every 6 (six) hours as needed for sleep.    [provider]  fluticasone  (FLONASE ) 50 MCG/ACT nasal spray SPRAY 2 SPRAYS INTO EACH NOSTRIL EVERY DAY 10/18/23   Rilla Baller, MD  hydrocortisone  2.5 % cream Apply topically as needed.    [provider]  levocetirizine (XYZAL ) 5 MG tablet Take 1 tablet (5 mg total) by mouth every evening. 09/11/23   Rilla Baller, MD  losartan  (COZAAR ) 50 MG tablet Take 1 tablet (50 mg total) by mouth daily. 01/02/23   Rilla Baller, MD  Melatonin 10 MG TABS Take 10 mg by mouth at bedtime as needed (sleep).  [provider]  Multiple Vitamin (MULTIVITAMIN) tablet Take 1 tablet by mouth daily.    [provider]  ondansetron  (ZOFRAN -ODT) 4 MG disintegrating tablet Take 1 tablet (4 mg total) by mouth every 8 (eight) hours as needed. Patient not taking: Reported on 10/18/2023 08/02/23   Kennyth Domino, FNP  pantoprazole  (PROTONIX ) 20 MG tablet Take 1 tablet (20 mg total) by mouth daily. Patient not taking: Reported on 10/18/2023 08/06/23 09/05/23  Mdala-Gausi, Masiku Agatha, MD  promethazine -dextromethorphan (PROMETHAZINE -DM) 6.25-15  MG/5ML syrup Take 5 mLs by mouth 4 (four) times daily as needed. 11/26/23   Corlis Burnard DEL, NP  propranolol  (INDERAL ) 40 MG tablet Take 0.5 tablets (20 mg total) by mouth 2 (two) times daily. 01/02/23   Rilla Baller, MD  tamsulosin  (FLOMAX ) 0.4 MG CAPS capsule Take 1 capsule (0.4 mg total) by mouth daily after supper. 01/02/23   Rilla Baller, MD    Family History Family History  Problem Relation Age of Onset   Heart disease Mother 80       CABG x 3   Hypertension Mother    COPD Mother    Hypertension Maternal Grandmother    Stroke Maternal Grandmother    Stroke Maternal Grandfather    Cancer Maternal Grandfather        stomach    Social History Social History   Tobacco Use   Smoking status: Never   Smokeless tobacco: Never  Vaping Use   Vaping status: Never Used  Substance Use Topics   Alcohol use: Yes    Alcohol/week: 0.0 standard drinks of alcohol   Drug use: No     Allergies   Chicken allergy   Review of Systems Review of Systems  Constitutional:  Negative for chills and fever.  HENT:  Positive for congestion and rhinorrhea. Negative for ear pain and sore throat.   Eyes:  Positive for discharge, redness, itching and visual disturbance. Negative for pain.  Respiratory:  Negative for cough and shortness of breath.      Physical Exam Triage Vital Signs ED Triage Vitals [11/29/23 1920]  Encounter Vitals Group     BP 135/77     Girls Systolic BP Percentile      Girls Diastolic BP Percentile      Boys Systolic BP Percentile      Boys Diastolic BP Percentile      Pulse Rate 71     Resp 18     Temp 97.7 F (36.5 C)     Temp src      SpO2 98 %     Weight      Height      Head Circumference      Peak Flow      Pain Score      Pain Loc      Pain Education      Exclude from Growth Chart    No data found.  Updated Vital Signs BP 135/77   Pulse 71   Temp 97.7 F (36.5 C)   Resp 18   SpO2 98%   Visual Acuity Right Eye Distance: 20/50 Left  Eye Distance: NOT ABLE Bilateral Distance: 20/50  Right Eye Near:   Left Eye Near:    Bilateral Near:     Physical Exam Constitutional:      General: He is not in acute distress. HENT:     Right Ear: Tympanic membrane normal.     Left Ear: Tympanic membrane normal.     Nose: Congestion and  rhinorrhea present.     Mouth/Throat:     Mouth: Mucous membranes are moist.     Pharynx: Oropharynx is clear.  Eyes:     General: Lids are normal.     Conjunctiva/sclera:     Left eye: Left conjunctiva is injected.     Pupils: Pupils are equal, round, and reactive to light.     Comments: Left eye redness and purulent drainage.  Cardiovascular:     Rate and Rhythm: Normal rate and regular rhythm.     Heart sounds: Normal heart sounds.  Pulmonary:     Effort: Pulmonary effort is normal. No respiratory distress.     Breath sounds: Normal breath sounds.  Neurological:     Mental Status: He is alert.      UC Treatments / Results  Labs (all labs ordered are listed, but only abnormal results are displayed) Labs Reviewed - No data to display  EKG   Radiology No results found.  Procedures Procedures (including critical care time)  Medications Ordered in UC Medications - No data to display  Initial Impression / Assessment and Plan / UC Course  I have reviewed the triage vital signs and the nursing notes.  Pertinent labs & imaging results that were available during my care of the patient were reviewed by me and considered in my medical decision making (see chart for details).    Bacterial conjunctivitis of left eye, acute sinusitis.  Treating today with 7-day course of Augmentin  and Polytrim  eyedrops.  Education provided on bacterial conjunctivitis and sinus infection.  Instructed patient to follow-up with his PCP on Monday.  ED precautions given.  He agrees to plan of care.  Final Clinical Impressions(s) / UC Diagnoses   Final diagnoses:  Bacterial conjunctivitis of left eye   Acute non-recurrent maxillary sinusitis     Discharge Instructions      Take the Augmentin  and use the antibiotic eyedrops as directed.  Follow-up with your primary care provider on Monday.     ED Prescriptions     Medication Sig Dispense Auth. Provider   amoxicillin -clavulanate (AUGMENTIN ) 875-125 MG tablet Take 1 tablet by mouth every 12 (twelve) hours. 14 tablet Corlis Burnard DEL, NP   trimethoprim -polymyxin b  (POLYTRIM ) ophthalmic solution Place 1 drop into both eyes 4 (four) times daily for 7 days. 10 mL Corlis Burnard DEL, NP      PDMP not reviewed this encounter.   Corlis Burnard DEL, NP 11/29/23 1941

## 2023-12-23 ENCOUNTER — Other Ambulatory Visit: Payer: Self-pay | Admitting: Urology

## 2023-12-23 DIAGNOSIS — D49511 Neoplasm of unspecified behavior of right kidney: Secondary | ICD-10-CM

## 2023-12-24 ENCOUNTER — Telehealth: Payer: Self-pay

## 2023-12-24 ENCOUNTER — Encounter: Payer: Self-pay | Admitting: Urology

## 2023-12-24 NOTE — Telephone Encounter (Signed)
 Left VM for patient to return call about open referral to Uro placed on 08/12/23.

## 2024-01-01 ENCOUNTER — Ambulatory Visit
Admission: RE | Admit: 2024-01-01 | Discharge: 2024-01-01 | Disposition: A | Discharge status: Home / Self Care | Payer: Self-pay | Source: Ambulatory Visit | Attending: Family Medicine | Admitting: Family Medicine

## 2024-01-01 DIAGNOSIS — Z8249 Family history of ischemic heart disease and other diseases of the circulatory system: Secondary | ICD-10-CM | POA: Insufficient documentation

## 2024-01-01 DIAGNOSIS — I10 Essential (primary) hypertension: Secondary | ICD-10-CM | POA: Insufficient documentation

## 2024-01-03 ENCOUNTER — Other Ambulatory Visit: Payer: Self-pay | Admitting: Family Medicine

## 2024-01-03 ENCOUNTER — Ambulatory Visit: Payer: Self-pay | Admitting: Family Medicine

## 2024-01-03 DIAGNOSIS — R251 Tremor, unspecified: Secondary | ICD-10-CM

## 2024-01-03 MED ORDER — ASPIRIN 81 MG PO TBEC: 81.0000 mg | DELAYED_RELEASE_TABLET | Freq: Every day | ORAL | Status: AC

## 2024-01-03 MED ORDER — ATORVASTATIN CALCIUM 20 MG PO TABS: 20.0000 mg | ORAL_TABLET | Freq: Every day | ORAL | 3 refills | Status: DC

## 2024-01-03 NOTE — Telephone Encounter (Signed)
 Spoke to pt's wife, sch cpe for 02/26/24

## 2024-01-03 NOTE — Telephone Encounter (Signed)
 Per March 2025 OV Note, pt was due to be seen in August 2025 for CPE. Please call pt to set up appt. Thanks

## 2024-01-21 ENCOUNTER — Ambulatory Visit
Admission: RE | Admit: 2024-01-21 | Discharge: 2024-01-21 | Disposition: A | Discharge status: Home / Self Care | Source: Ambulatory Visit | Attending: Urology

## 2024-01-21 DIAGNOSIS — D49511 Neoplasm of unspecified behavior of right kidney: Secondary | ICD-10-CM

## 2024-01-21 MED ORDER — GADOPICLENOL 0.5 MMOL/ML IV SOLN
10.0000 mL | Freq: Once | INTRAVENOUS | Status: AC | PRN
  Administered 2024-01-21: 10 mL via INTRAVENOUS

## 2024-02-16 ENCOUNTER — Other Ambulatory Visit: Payer: Self-pay | Admitting: Family Medicine

## 2024-02-16 DIAGNOSIS — E559 Vitamin D deficiency, unspecified: Secondary | ICD-10-CM | POA: Insufficient documentation

## 2024-02-16 DIAGNOSIS — R931 Abnormal findings on diagnostic imaging of heart and coronary circulation: Secondary | ICD-10-CM | POA: Insufficient documentation

## 2024-02-16 DIAGNOSIS — Z8249 Family history of ischemic heart disease and other diseases of the circulatory system: Secondary | ICD-10-CM

## 2024-02-16 DIAGNOSIS — R972 Elevated prostate specific antigen [PSA]: Secondary | ICD-10-CM

## 2024-02-16 DIAGNOSIS — R7401 Elevation of levels of liver transaminase levels: Secondary | ICD-10-CM

## 2024-02-18 ENCOUNTER — Other Ambulatory Visit (INDEPENDENT_AMBULATORY_CARE_PROVIDER_SITE_OTHER)

## 2024-02-18 DIAGNOSIS — R931 Abnormal findings on diagnostic imaging of heart and coronary circulation: Secondary | ICD-10-CM | POA: Diagnosis not present

## 2024-02-18 DIAGNOSIS — E559 Vitamin D deficiency, unspecified: Secondary | ICD-10-CM

## 2024-02-18 DIAGNOSIS — R7401 Elevation of levels of liver transaminase levels: Secondary | ICD-10-CM | POA: Diagnosis not present

## 2024-02-18 DIAGNOSIS — Z8249 Family history of ischemic heart disease and other diseases of the circulatory system: Secondary | ICD-10-CM

## 2024-02-18 DIAGNOSIS — R972 Elevated prostate specific antigen [PSA]: Secondary | ICD-10-CM

## 2024-02-18 LAB — COMPREHENSIVE METABOLIC PANEL WITH GFR
ALT: 22 U/L (ref 0–53)
AST: 20 U/L (ref 0–37)
Albumin: 4 g/dL (ref 3.5–5.2)
Alkaline Phosphatase: 73 U/L (ref 39–117)
BUN: 12 mg/dL (ref 6–23)
CO2: 30 meq/L (ref 19–32)
Calcium: 9 mg/dL (ref 8.4–10.5)
Chloride: 106 meq/L (ref 96–112)
Creatinine, Ser: 0.97 mg/dL (ref 0.40–1.50)
GFR: 82.62 mL/min (ref >60.00)
Glucose, Bld: 105 mg/dL — ABNORMAL HIGH (ref 70–99)
Potassium: 4.6 meq/L (ref 3.5–5.1)
Sodium: 140 meq/L (ref 135–145)
Total Bilirubin: 0.6 mg/dL (ref 0.2–1.2)
Total Protein: 6.1 g/dL (ref 6.0–8.3)

## 2024-02-18 LAB — CBC WITH DIFFERENTIAL/PLATELET
Basophils Absolute: 0 K/uL (ref 0.0–0.1)
Basophils Relative: 0.2 % (ref 0.0–3.0)
Eosinophils Absolute: 0.2 K/uL (ref 0.0–0.7)
Eosinophils Relative: 2.4 % (ref 0.0–5.0)
HCT: 43.6 % (ref 39.0–52.0)
Hemoglobin: 14.8 g/dL (ref 13.0–17.0)
Lymphocytes Relative: 17.9 % (ref 12.0–46.0)
Lymphs Abs: 1.6 K/uL (ref 0.7–4.0)
MCHC: 33.9 g/dL (ref 30.0–36.0)
MCV: 91 fl (ref 78.0–100.0)
Monocytes Absolute: 0.9 K/uL (ref 0.1–1.0)
Monocytes Relative: 10.6 % (ref 3.0–12.0)
Neutro Abs: 6.1 K/uL (ref 1.4–7.7)
Neutrophils Relative %: 68.9 % (ref 43.0–77.0)
Platelets: 261 K/uL (ref 150.0–400.0)
RBC: 4.8 Mil/uL (ref 4.22–5.81)
RDW: 14.2 % (ref 11.5–15.5)
WBC: 8.9 K/uL (ref 4.0–10.5)

## 2024-02-18 LAB — LIPID PANEL
Cholesterol: 102 mg/dL (ref 0–200)
HDL: 45 mg/dL (ref >39.00)
LDL Cholesterol: 45 mg/dL (ref 0–99)
NonHDL: 56.7
Total CHOL/HDL Ratio: 2
Triglycerides: 58 mg/dL (ref 0.0–149.0)
VLDL: 11.6 mg/dL (ref 0.0–40.0)

## 2024-02-18 LAB — VITAMIN D 25 HYDROXY (VIT D DEFICIENCY, FRACTURES): VITD: 22.03 ng/mL — ABNORMAL LOW (ref 30.00–100.00)

## 2024-02-18 LAB — PROTIME-INR
INR: 1.1 ratio — ABNORMAL HIGH (ref 0.8–1.0)
Prothrombin Time: 11.7 s (ref 9.6–13.1)

## 2024-02-18 LAB — GAMMA GT: GGT: 21 U/L (ref 7–51)

## 2024-02-21 ENCOUNTER — Ambulatory Visit: Payer: Self-pay | Admitting: Family Medicine

## 2024-02-26 ENCOUNTER — Ambulatory Visit (INDEPENDENT_AMBULATORY_CARE_PROVIDER_SITE_OTHER): Admitting: Family Medicine

## 2024-02-26 VITALS — BP 130/70 | HR 65 | Temp 98.5°F | Ht 69.0 in | Wt 210.0 lb

## 2024-02-26 DIAGNOSIS — Z23 Encounter for immunization: Secondary | ICD-10-CM

## 2024-02-26 DIAGNOSIS — I10 Essential (primary) hypertension: Secondary | ICD-10-CM | POA: Diagnosis not present

## 2024-02-26 DIAGNOSIS — E559 Vitamin D deficiency, unspecified: Secondary | ICD-10-CM

## 2024-02-26 DIAGNOSIS — Z0000 Encounter for general adult medical examination without abnormal findings: Secondary | ICD-10-CM

## 2024-02-26 DIAGNOSIS — Z7189 Other specified counseling: Secondary | ICD-10-CM | POA: Insufficient documentation

## 2024-02-26 DIAGNOSIS — Z8249 Family history of ischemic heart disease and other diseases of the circulatory system: Secondary | ICD-10-CM

## 2024-02-26 DIAGNOSIS — R931 Abnormal findings on diagnostic imaging of heart and coronary circulation: Secondary | ICD-10-CM

## 2024-02-26 DIAGNOSIS — R972 Elevated prostate specific antigen [PSA]: Secondary | ICD-10-CM

## 2024-02-26 DIAGNOSIS — R3912 Poor urinary stream: Secondary | ICD-10-CM

## 2024-02-26 DIAGNOSIS — R251 Tremor, unspecified: Secondary | ICD-10-CM

## 2024-02-26 DIAGNOSIS — N401 Enlarged prostate with lower urinary tract symptoms: Secondary | ICD-10-CM

## 2024-02-26 DIAGNOSIS — N2889 Other specified disorders of kidney and ureter: Secondary | ICD-10-CM

## 2024-02-26 DIAGNOSIS — J209 Acute bronchitis, unspecified: Secondary | ICD-10-CM

## 2024-02-26 LAB — PSA, TOTAL WITH REFLEX TO PSA, FREE

## 2024-02-26 MED ORDER — ATORVASTATIN CALCIUM 20 MG PO TABS: 20.0000 mg | ORAL_TABLET | Freq: Every day | ORAL | 3 refills | Status: AC

## 2024-02-26 MED ORDER — PROPRANOLOL HCL 40 MG PO TABS: 20.0000 mg | ORAL_TABLET | Freq: Two times a day (BID) | ORAL | 3 refills | Status: AC

## 2024-02-26 MED ORDER — ALBUTEROL SULFATE HFA 108 (90 BASE) MCG/ACT IN AERS: 2.0000 | INHALATION_SPRAY | Freq: Four times a day (QID) | RESPIRATORY_TRACT | 1 refills | Status: AC | PRN

## 2024-02-26 MED ORDER — LOSARTAN POTASSIUM 50 MG PO TABS: 50.0000 mg | ORAL_TABLET | Freq: Every day | ORAL | 3 refills | Status: AC

## 2024-02-26 MED ORDER — VITAMIN D3 25 MCG (1000 UT) PO CAPS: 1.0000 | ORAL_CAPSULE | Freq: Every day | ORAL | Status: AC

## 2024-02-26 MED ORDER — GUAIFENESIN-CODEINE 100-10 MG/5ML PO SOLN: 5.0000 mL | Freq: Three times a day (TID) | ORAL | 0 refills | Status: AC | PRN

## 2024-02-26 NOTE — Assessment & Plan Note (Signed)
 Chronic, stable. Continue current regimen.

## 2024-02-26 NOTE — Patient Instructions (Addendum)
 Lab test today  Flu shot today  Start vit D3 1000 units daily  For cough - may use albuterol rescue inhaler as needed, may use codeine cough syrup as needed, let me know if not improving as expected or new fever, worsening productive cough.  Continue current medicines Good to see you today Return as needed or in 1 year for next physical.

## 2024-02-26 NOTE — Assessment & Plan Note (Addendum)
 Chronic, overall stable period on flomax  0.4mg  daily - continue  Discussed possible finasteride. Will redraw PSA total/free today - as lab lost sample last week for this test.

## 2024-02-26 NOTE — Progress Notes (Signed)
 Ph: [phone] Fax: [phone]   Patient ID: Joel Morales, male    DOB: [birth year], 63 y.o.   MRN: [id number]  This visit was conducted in person.  BP 130/70   Pulse 65   Temp 98.5 F (36.9 C) (Oral)   Ht 5' 9 (1.753 m)   Wt 210 lb (95.3 kg)   SpO2 98%   BMI 31.01 kg/m   140/70 on repeat testing.   CC: CPE Subjective:   HPI: Joel Morales is a 63 y.o. male presenting on 02/26/2024 for Annual Exam   Hospitalized 07/2023 for transaminitis with hyperbilirubinemia thought due to residual sludge after cholecystectomy 04/2023 - fully recovered and asymptomatic since then.   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   Cardiac CT (12/2023) with calcium  score 997 (93%) - started aspirin  81mg  daily and atorvastatin  for goal LDL <70 on 12/2023.    8d h/o acute resp infection, overall improving but with persistent cough worse at night time. Afebrile. Treating with delsym with limited benefit.  No h/o asthma. No h/o COPD or smoking history.   Preventative: Colonoscopy 09/2011 - TA, diverticulosis, rpt 5 yrs [facility])  COLONOSCOPY 08/2022 - TAx8, diverticulosis, rpt 3 yrs [facility])  Prostate cancer screening - h/o BPH on terazosin . Nocturia x1-3, occ weak stream. No fmhx prostate cancer. Fmhx BPH.  Lung cancer screening - not eligible  Flu shot - yearly COVID - J&J 08/2019, Pfizer 03/2020, bivalent 01/2021 Tdap 2016 Prevnar-20 - deferred until feeling better Shingrix - 11/2022, 12/2023 Advanced directive discussion - does have this at home - asked for copy. Wife Sonny is HCPOA. Full code.  Seat belt use discussed Sunscreen use discussed. No changing moles on skin.  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 Interim medical history since our last visit reviewed. Allergies and medications reviewed and updated. Outpatient Medications Prior to Visit  Medication Sig Dispense Refill   aspirin  EC 81 MG tablet Take 1 tablet (81 mg total) by mouth daily. Swallow whole.     Azelastine HCl 137 MCG/SPRAY SOLN      diphenhydrAMINE  (BENADRYL ) 25 MG tablet Take 25 mg by mouth every 6 (six) hours as needed for sleep.     fluticasone  (FLONASE ) 50 MCG/ACT nasal spray SPRAY 2 SPRAYS INTO EACH NOSTRIL EVERY DAY 48 mL 1   hydrocortisone  2.5 % cream Apply topically as needed.     levocetirizine (XYZAL ) 5 MG tablet Take 1 tablet (5 mg total) by mouth every evening. 90 tablet 1   Melatonin 10 MG TABS Take 10 mg by mouth at bedtime as needed (sleep).     pseudoephedrine (SUDAFED) 120 MG 12 hr tablet Take 120 mg by mouth 2 (two) times daily.     tamsulosin  (FLOMAX ) 0.4 MG CAPS capsule Take 1 capsule (0.4 mg total) by mouth daily after supper. 90 capsule 4   atorvastatin  (LIPITOR) 20 MG tablet Take 1 tablet (20 mg total) by mouth daily. 90 tablet 3   losartan  (COZAAR )  50 MG tablet Take 1 tablet (50 mg total) by mouth daily. 90 tablet 4   propranolol  (INDERAL ) 40 MG tablet TAKE 1/2 TABLET BY MOUTH TWICE A DAY 90 tablet 0   acetaminophen  (TYLENOL ) 500 MG tablet Take 2 tablets (1,000 mg total) by mouth every 6 (six) hours. (Patient taking differently: Take 500 mg by mouth daily as needed for mild pain (pain score 1-3).) 30 tablet 0   ondansetron  (ZOFRAN -ODT) 4 MG disintegrating tablet Take 1 tablet (4 mg total) by mouth every 8 (eight) hours as needed. (Patient not taking: Reported on 10/18/2023) 20 tablet 0   No facility-administered medications prior to visit.     Per HPI unless specifically indicated in ROS  section below Review of Systems  Constitutional:  Negative for activity change, appetite change, chills, fatigue, fever and unexpected weight change.  HENT:  Positive for congestion and sore throat. Negative for hearing loss.   Eyes:  Negative for visual disturbance.  Respiratory:  Positive for cough and wheezing. Negative for chest tightness and shortness of breath.   Cardiovascular:  Negative for chest pain, palpitations and leg swelling.  Gastrointestinal:  Positive for constipation (occ). Negative for abdominal distention, abdominal pain, blood in stool, diarrhea, nausea and vomiting.  Genitourinary:  Negative for difficulty urinating and hematuria.  Musculoskeletal:  Negative for arthralgias, myalgias and neck pain.  Skin:  Negative for rash.  Neurological:  Negative for dizziness, seizures, syncope and headaches.  Hematological:  Negative for adenopathy. Does not bruise/bleed easily.  Psychiatric/Behavioral:  Negative for dysphoric mood. The patient is not nervous/anxious.     Objective:  BP 130/70   Pulse 65   Temp 98.5 F (36.9 C) (Oral)   Ht 5' 9 (1.753 m)   Wt 210 lb (95.3 kg)   SpO2 98%   BMI 31.01 kg/m   Wt Readings from Last 3 Encounters:  02/26/24 210 lb (95.3 kg)  08/12/23 210 lb 2 oz (95.3 kg)  08/02/23 210 lb 1.6 oz (95.3 kg)      Physical Exam Vitals and nursing note reviewed.  Constitutional:      General: He is not in acute distress.    Appearance: Normal appearance. He is well-developed. He is not ill-appearing.  HENT:     Head: Normocephalic and atraumatic.     Right Ear: Hearing, tympanic membrane, ear canal and external ear normal.     Left Ear: Hearing, tympanic membrane, ear canal and external ear normal.     Mouth/Throat:     Mouth: Mucous membranes are moist.     Pharynx: Oropharynx is clear. No oropharyngeal exudate or posterior oropharyngeal erythema.  Eyes:     General: No scleral icterus.    Extraocular Movements: Extraocular movements  intact.     Conjunctiva/sclera: Conjunctivae normal.     Pupils: Pupils are equal, round, and reactive to light.  Neck:     Thyroid: No thyroid mass or thyromegaly.  Cardiovascular:     Rate and Rhythm: Normal rate and regular rhythm.     Pulses: Normal pulses.          Radial pulses are 2+ on the right side and 2+ on the left side.     Heart sounds: Normal heart sounds. No murmur heard. Pulmonary:     Effort: Pulmonary effort is normal. No respiratory distress.     Breath sounds: Normal breath sounds. No wheezing, rhonchi or rales.  Abdominal:     General: Bowel sounds are normal. There is no distension.  Palpations: Abdomen is soft. There is no mass.     Tenderness: There is no abdominal tenderness. There is no guarding or rebound.     Hernia: No hernia is present.  Musculoskeletal:        General: Normal range of motion.     Cervical back: Normal range of motion and neck supple.     Right lower leg: No edema.     Left lower leg: No edema.  Lymphadenopathy:     Cervical: No cervical adenopathy.  Skin:    General: Skin is warm and dry.     Findings: No rash.  Neurological:     General: No focal deficit present.     Mental Status: He is alert and oriented to person, place, and time.  Psychiatric:        Mood and Affect: Mood normal.        Behavior: Behavior normal.        Thought Content: Thought content normal.        Judgment: Judgment normal.       Results for orders placed or performed in visit on 02/18/24  CBC with Differential/Platelet   Collection Time: 02/18/24  8:56 AM  Result Value Ref Range   WBC 8.9 4.0 - 10.5 K/uL   RBC 4.80 4.22 - 5.81 Mil/uL   Hemoglobin 14.8 13.0 - 17.0 g/dL   HCT 56.3 60.9 - 47.9 %   MCV 91.0 78.0 - 100.0 fl   MCHC 33.9 30.0 - 36.0 g/dL   RDW 85.7 88.4 - 84.4 %   Platelets 261.0 150.0 - 400.0 K/uL   Neutrophils Relative % 68.9 43.0 - 77.0 %   Lymphocytes Relative 17.9 12.0 - 46.0 %   Monocytes Relative 10.6 3.0 - 12.0 %    Eosinophils Relative 2.4 0.0 - 5.0 %   Basophils Relative 0.2 0.0 - 3.0 %   Neutro Abs 6.1 1.4 - 7.7 K/uL   Lymphs Abs 1.6 0.7 - 4.0 K/uL   Monocytes Absolute 0.9 0.1 - 1.0 K/uL   Eosinophils Absolute 0.2 0.0 - 0.7 K/uL   Basophils Absolute 0.0 0.0 - 0.1 K/uL  Protime-INR   Collection Time: 02/18/24  8:56 AM  Result Value Ref Range   INR 1.1 (H) 0.8 - 1.0 ratio   Prothrombin Time 11.7 9.6 - 13.1 sec  Gamma GT   Collection Time: 02/18/24  8:56 AM  Result Value Ref Range   GGT 21 7 - 51 U/L  VITAMIN D  25 Hydroxy (Vit-D Deficiency, Fractures)   Collection Time: 02/18/24  8:56 AM  Result Value Ref Range   VITD 22.03 (L) 30.00 - 100.00 ng/mL  PSA, Total with Reflex to PSA, Free   Collection Time: 02/18/24  8:56 AM  Result Value Ref Range   PSA, Total CANCELED   Lipid panel   Collection Time: 02/18/24  8:56 AM  Result Value Ref Range   Cholesterol 102 0 - 200 mg/dL   Triglycerides 41.9 0.0 - 149.0 mg/dL   HDL 54.99 >60.99 mg/dL   VLDL 88.3 0.0 - 59.9 mg/dL   LDL Cholesterol 45 0 - 99 mg/dL   Total CHOL/HDL Ratio 2    NonHDL 56.70   Comprehensive metabolic panel with GFR   Collection Time: 02/18/24  8:56 AM  Result Value Ref Range   Sodium 140 135 - 145 mEq/L   Potassium 4.6 3.5 - 5.1 mEq/L   Chloride 106 96 - 112 mEq/L   CO2 30 19 - 32 mEq/L  Glucose, Bld 105 (H) 70 - 99 mg/dL   BUN 12 6 - 23 mg/dL   Creatinine, Ser 9.02 0.40 - 1.50 mg/dL   Total Bilirubin 0.6 0.2 - 1.2 mg/dL   Alkaline Phosphatase 73 39 - 117 U/L   AST 20 0 - 37 U/L   ALT 22 0 - 53 U/L   Total Protein 6.1 6.0 - 8.3 g/dL   Albumin 4.0 3.5 - 5.2 g/dL   GFR 17.37 >39.99 mL/min   Calcium  9.0 8.4 - 10.5 mg/dL   Lab Results  Component Value Date   PSA 4.11 (H) 12/02/2019   PSA 4.87 (H) 08/31/2014   Assessment & Plan:   Problem List Items Addressed This Visit     Health maintenance examination - Primary (Chronic)   Preventative protocols reviewed and updated unless pt declined. Discussed healthy  diet and lifestyle.       Advanced directives, counseling/discussion (Chronic)   Advanced directive discussion - does have this at home - asked for copy. Wife Sonny is HCPOA. Full code.       Benign prostatic hyperplasia with weak urinary stream   Chronic, overall stable period on flomax  0.4mg  daily - continue  Discussed possible finasteride. Will redraw PSA total/free today - as lab lost sample last week for this test.       Relevant Orders   PSA, Total with Reflex to PSA, Free   Essential hypertension   Chronic, stable. Continue current regimen.       Relevant Medications   losartan  (COZAAR ) 50 MG tablet   propranolol  (INDERAL ) 40 MG tablet   atorvastatin  (LIPITOR) 20 MG tablet   Tremor   Continue propranolol  20mg  BID for presumed ET.       Relevant Medications   propranolol  (INDERAL ) 40 MG tablet   Elevated PSA   Anticipate BPH related.  Overall stable 4's. Fmhx BPH, no fmhx prostate cancer.  Update PSA total/free       Relevant Orders   PSA, Total with Reflex to PSA, Free   Family history of coronary artery disease   Renal mass, right   Appreciate urology care - continue active surveillance through them with Q6mo MRI To update if new symptoms develop in interim.       Agatston coronary artery calcium  score greater than 400   Again reviewed with patient, predominant calcium  to LAD. Pt remains asymptomatic. Mother had CABG at age 21.  Started on atorvastatin  and aspirin , tolerating well - will continue this for goal LDL <70.       Relevant Medications   losartan  (COZAAR ) 50 MG tablet   propranolol  (INDERAL ) 40 MG tablet   atorvastatin  (LIPITOR) 20 MG tablet   Vitamin D  deficiency   Rec start vit D 1000 units daily       Acute bronchitis   Story/exam suspicious for acute viral bronchitis with component of reactive airways. Rx albuterol inhaler, codeine cough syrup.  Update if fever, worsening productive cough, or not improving with treatment as expected.        Other Visit Diagnoses       Encounter for immunization       Relevant Orders   Flu vaccine trivalent PF, 6mos and older(Flulaval,Afluria,Fluarix,Fluzone) (Completed)        Meds ordered this encounter  Medications   losartan  (COZAAR ) 50 MG tablet    Sig: Take 1 tablet (50 mg total) by mouth daily.    Dispense:  90 tablet    Refill:  3  propranolol  (INDERAL ) 40 MG tablet    Sig: Take 0.5 tablets (20 mg total) by mouth 2 (two) times daily.    Dispense:  90 tablet    Refill:  3   atorvastatin  (LIPITOR) 20 MG tablet    Sig: Take 1 tablet (20 mg total) by mouth daily.    Dispense:  90 tablet    Refill:  3   albuterol (VENTOLIN HFA) 108 (90 Base) MCG/ACT inhaler    Sig: Inhale 2 puffs into the lungs every 6 (six) hours as needed for wheezing or shortness of breath.    Dispense:  8 g    Refill:  1   guaiFENesin -codeine 100-10 MG/5ML syrup    Sig: Take 5 mLs by mouth 3 (three) times daily as needed for cough (sedation precautions).    Dispense:  120 mL    Refill:  0   Cholecalciferol (VITAMIN D3) 25 MCG (1000 UT) CAPS    Sig: Take 1 capsule (1,000 Units total) by mouth daily.    Orders Placed This Encounter  Procedures   Flu vaccine trivalent PF, 6mos and older(Flulaval,Afluria,Fluarix,Fluzone)   PSA, Total with Reflex to PSA, Free    Patient Instructions  Lab test today  Flu shot today  Start vit D3 1000 units daily  For cough - may use albuterol rescue inhaler as needed, may use codeine cough syrup as needed, let me know if not improving as expected or new fever, worsening productive cough.  Continue current medicines Good to see you today Return as needed or in 1 year for next physical.   Follow up plan: Return in about 1 year (around 02/25/2025) for annual exam, prior fasting for blood work.  Anton Blas, MD

## 2024-02-26 NOTE — Assessment & Plan Note (Signed)
 Advanced directive discussion - does have this at home - asked for copy. Wife Sonny is HCPOA. Full code.

## 2024-02-26 NOTE — Assessment & Plan Note (Addendum)
 Again reviewed with patient, predominant calcium  to LAD. Pt remains asymptomatic. Mother had CABG at age 64.  Started on atorvastatin  and aspirin , tolerating well - will continue this for goal LDL <70.

## 2024-02-26 NOTE — Assessment & Plan Note (Signed)
 Rec start vit D 1000 units daily.

## 2024-02-26 NOTE — Assessment & Plan Note (Signed)
 Story/exam suspicious for acute viral bronchitis with component of reactive airways. Rx albuterol inhaler, codeine cough syrup.  Update if fever, worsening productive cough, or not improving with treatment as expected.

## 2024-02-26 NOTE — Assessment & Plan Note (Signed)
 Anticipate BPH related.  Overall stable 4's. Fmhx BPH, no fmhx prostate cancer.  Update PSA total/free

## 2024-02-26 NOTE — Assessment & Plan Note (Signed)
 Preventative protocols reviewed and updated unless pt declined. Discussed healthy diet and lifestyle.

## 2024-02-26 NOTE — Assessment & Plan Note (Signed)
 Continue propranolol  20mg  BID for presumed ET.

## 2024-02-26 NOTE — Assessment & Plan Note (Addendum)
 Appreciate urology care - continue active surveillance through them with Q6mo MRI To update if new symptoms develop in interim.

## 2024-02-28 LAB — REFLEX PSA, FREE
PSA, % Free: 20 % — ABNORMAL LOW (ref >25)
PSA, Free: 1 ng/mL

## 2024-02-28 LAB — PSA, TOTAL WITH REFLEX TO PSA, FREE: PSA, Total: 4.9 ng/mL — ABNORMAL HIGH (ref <=4.0)

## 2024-02-29 ENCOUNTER — Ambulatory Visit: Payer: Self-pay | Admitting: Family Medicine

## 2024-02-29 DIAGNOSIS — R972 Elevated prostate specific antigen [PSA]: Secondary | ICD-10-CM

## 2024-03-29 ENCOUNTER — Other Ambulatory Visit: Payer: Self-pay | Admitting: Family Medicine

## 2024-05-25 DIAGNOSIS — D49511 Neoplasm of unspecified behavior of right kidney: Secondary | ICD-10-CM

## 2024-05-26 ENCOUNTER — Encounter: Payer: Self-pay | Admitting: Urology

## 2024-06-21 ENCOUNTER — Other Ambulatory Visit: Payer: Self-pay | Admitting: Family Medicine

## 2024-07-28 ENCOUNTER — Other Ambulatory Visit

## 2024-07-29 ENCOUNTER — Other Ambulatory Visit
